# Patient Record
Sex: Female | Born: 1950
Health system: Southern US, Community
[De-identification: ages and names within clinical notes are randomized; demographics above are authoritative.]

## PROBLEM LIST (undated history)

## (undated) DIAGNOSIS — M25569 Pain in unspecified knee: Secondary | ICD-10-CM

## (undated) DIAGNOSIS — I10 Essential (primary) hypertension: Secondary | ICD-10-CM

## (undated) DIAGNOSIS — K219 Gastro-esophageal reflux disease without esophagitis: Secondary | ICD-10-CM

## (undated) DIAGNOSIS — I82409 Acute embolism and thrombosis of unspecified deep veins of unspecified lower extremity: Secondary | ICD-10-CM

## (undated) DIAGNOSIS — I2699 Other pulmonary embolism without acute cor pulmonale: Secondary | ICD-10-CM

## (undated) DIAGNOSIS — M1990 Unspecified osteoarthritis, unspecified site: Secondary | ICD-10-CM

## (undated) DIAGNOSIS — G8929 Other chronic pain: Secondary | ICD-10-CM

## (undated) HISTORY — DX: Gastro-esophageal reflux disease without esophagitis: K21.9

## (undated) HISTORY — PX: TUBAL LIGATION: SHX77

---

## 2001-07-13 ENCOUNTER — Ambulatory Visit (HOSPITAL_COMMUNITY): Admission: RE | Admit: 2001-07-13 | Discharge: 2001-07-13 | Payer: Self-pay | Admitting: Family Medicine

## 2001-07-13 ENCOUNTER — Encounter: Payer: Self-pay | Admitting: Family Medicine

## 2001-08-13 ENCOUNTER — Emergency Department (HOSPITAL_COMMUNITY): Admission: EM | Admit: 2001-08-13 | Discharge: 2001-08-13 | Payer: Self-pay | Admitting: Emergency Medicine

## 2001-09-13 ENCOUNTER — Ambulatory Visit (HOSPITAL_COMMUNITY): Admission: RE | Admit: 2001-09-13 | Discharge: 2001-09-13 | Payer: Self-pay | Admitting: Family Medicine

## 2001-09-13 ENCOUNTER — Encounter: Payer: Self-pay | Admitting: Family Medicine

## 2002-05-23 ENCOUNTER — Encounter: Payer: Self-pay | Admitting: *Deleted

## 2002-05-23 ENCOUNTER — Emergency Department (HOSPITAL_COMMUNITY): Admission: EM | Admit: 2002-05-23 | Discharge: 2002-05-23 | Payer: Self-pay | Admitting: *Deleted

## 2002-06-15 ENCOUNTER — Encounter (HOSPITAL_COMMUNITY): Admission: RE | Admit: 2002-06-15 | Discharge: 2002-07-15 | Payer: Self-pay | Admitting: Family Medicine

## 2002-09-11 ENCOUNTER — Emergency Department (HOSPITAL_COMMUNITY): Admission: EM | Admit: 2002-09-11 | Discharge: 2002-09-12 | Payer: Self-pay | Admitting: *Deleted

## 2002-09-12 ENCOUNTER — Encounter: Payer: Self-pay | Admitting: *Deleted

## 2003-03-18 ENCOUNTER — Emergency Department (HOSPITAL_COMMUNITY): Admission: EM | Admit: 2003-03-18 | Discharge: 2003-03-18 | Payer: Self-pay | Admitting: Emergency Medicine

## 2003-10-14 ENCOUNTER — Emergency Department (HOSPITAL_COMMUNITY): Admission: EM | Admit: 2003-10-14 | Discharge: 2003-10-14 | Payer: Self-pay | Admitting: Emergency Medicine

## 2003-11-13 ENCOUNTER — Emergency Department (HOSPITAL_COMMUNITY): Admission: EM | Admit: 2003-11-13 | Discharge: 2003-11-13 | Payer: Self-pay | Admitting: Emergency Medicine

## 2005-03-10 ENCOUNTER — Emergency Department (HOSPITAL_COMMUNITY): Admission: EM | Admit: 2005-03-10 | Discharge: 2005-03-10 | Payer: Self-pay | Admitting: Emergency Medicine

## 2006-03-04 ENCOUNTER — Emergency Department (HOSPITAL_COMMUNITY): Admission: EM | Admit: 2006-03-04 | Discharge: 2006-03-04 | Payer: Self-pay | Admitting: Emergency Medicine

## 2006-03-09 ENCOUNTER — Emergency Department (HOSPITAL_COMMUNITY): Admission: EM | Admit: 2006-03-09 | Discharge: 2006-03-09 | Payer: Self-pay | Admitting: Emergency Medicine

## 2006-04-20 ENCOUNTER — Encounter: Payer: Self-pay | Admitting: Orthopedic Surgery

## 2006-04-20 ENCOUNTER — Emergency Department (HOSPITAL_COMMUNITY): Admission: EM | Admit: 2006-04-20 | Discharge: 2006-04-20 | Payer: Self-pay | Admitting: Emergency Medicine

## 2006-08-30 ENCOUNTER — Emergency Department (HOSPITAL_COMMUNITY): Admission: EM | Admit: 2006-08-30 | Discharge: 2006-08-30 | Payer: Self-pay | Admitting: Emergency Medicine

## 2006-12-29 ENCOUNTER — Encounter: Payer: Self-pay | Admitting: Orthopedic Surgery

## 2006-12-29 ENCOUNTER — Emergency Department (HOSPITAL_COMMUNITY): Admission: EM | Admit: 2006-12-29 | Discharge: 2006-12-29 | Payer: Self-pay | Admitting: Emergency Medicine

## 2007-01-09 ENCOUNTER — Emergency Department (HOSPITAL_COMMUNITY): Admission: EM | Admit: 2007-01-09 | Discharge: 2007-01-09 | Payer: Self-pay | Admitting: Emergency Medicine

## 2007-01-26 ENCOUNTER — Ambulatory Visit (HOSPITAL_COMMUNITY): Admission: RE | Admit: 2007-01-26 | Discharge: 2007-01-26 | Payer: Self-pay | Admitting: Internal Medicine

## 2007-03-30 ENCOUNTER — Ambulatory Visit: Payer: Self-pay | Admitting: Orthopedic Surgery

## 2007-03-30 DIAGNOSIS — M25569 Pain in unspecified knee: Secondary | ICD-10-CM | POA: Insufficient documentation

## 2007-03-30 DIAGNOSIS — IMO0002 Reserved for concepts with insufficient information to code with codable children: Secondary | ICD-10-CM | POA: Insufficient documentation

## 2007-03-30 DIAGNOSIS — M549 Dorsalgia, unspecified: Secondary | ICD-10-CM | POA: Insufficient documentation

## 2007-03-30 DIAGNOSIS — M1710 Unilateral primary osteoarthritis, unspecified knee: Secondary | ICD-10-CM | POA: Insufficient documentation

## 2007-04-02 ENCOUNTER — Telehealth: Payer: Self-pay | Admitting: Orthopedic Surgery

## 2007-04-07 ENCOUNTER — Encounter: Payer: Self-pay | Admitting: Orthopedic Surgery

## 2007-04-07 ENCOUNTER — Encounter (HOSPITAL_COMMUNITY): Admission: RE | Admit: 2007-04-07 | Discharge: 2007-05-07 | Payer: Self-pay | Admitting: Orthopedic Surgery

## 2007-04-07 ENCOUNTER — Ambulatory Visit (HOSPITAL_COMMUNITY): Admission: RE | Admit: 2007-04-07 | Discharge: 2007-04-07 | Payer: Self-pay | Admitting: Orthopedic Surgery

## 2007-05-04 ENCOUNTER — Encounter: Payer: Self-pay | Admitting: Orthopedic Surgery

## 2007-05-10 ENCOUNTER — Encounter (HOSPITAL_COMMUNITY): Admission: RE | Admit: 2007-05-10 | Discharge: 2007-06-09 | Payer: Self-pay | Admitting: Orthopedic Surgery

## 2007-05-13 ENCOUNTER — Ambulatory Visit: Payer: Self-pay | Admitting: Orthopedic Surgery

## 2007-05-19 ENCOUNTER — Encounter: Payer: Self-pay | Admitting: Orthopedic Surgery

## 2007-08-27 ENCOUNTER — Encounter: Payer: Self-pay | Admitting: Orthopedic Surgery

## 2010-02-09 ENCOUNTER — Encounter: Payer: Self-pay | Admitting: Family Medicine

## 2010-10-28 LAB — URINALYSIS, ROUTINE W REFLEX MICROSCOPIC
Bilirubin Urine: NEGATIVE
Ketones, ur: NEGATIVE
Nitrite: NEGATIVE
Protein, ur: NEGATIVE
Urobilinogen, UA: 0.2
pH: 5.5

## 2010-11-09 ENCOUNTER — Emergency Department (HOSPITAL_COMMUNITY)
Admission: EM | Admit: 2010-11-09 | Discharge: 2010-11-09 | Disposition: A | Discharge status: Home / Self Care | Payer: Medicaid Other | Attending: Emergency Medicine | Admitting: Emergency Medicine

## 2010-11-09 ENCOUNTER — Encounter: Payer: Self-pay | Admitting: *Deleted

## 2010-11-09 ENCOUNTER — Emergency Department (HOSPITAL_COMMUNITY): Payer: Medicaid Other

## 2010-11-09 DIAGNOSIS — M25579 Pain in unspecified ankle and joints of unspecified foot: Secondary | ICD-10-CM | POA: Insufficient documentation

## 2010-11-09 DIAGNOSIS — M1990 Unspecified osteoarthritis, unspecified site: Secondary | ICD-10-CM | POA: Insufficient documentation

## 2010-11-09 DIAGNOSIS — J45909 Unspecified asthma, uncomplicated: Secondary | ICD-10-CM | POA: Insufficient documentation

## 2010-11-09 DIAGNOSIS — I10 Essential (primary) hypertension: Secondary | ICD-10-CM | POA: Insufficient documentation

## 2010-11-09 DIAGNOSIS — Z87891 Personal history of nicotine dependence: Secondary | ICD-10-CM | POA: Insufficient documentation

## 2010-11-09 DIAGNOSIS — M25571 Pain in right ankle and joints of right foot: Secondary | ICD-10-CM

## 2010-11-09 HISTORY — DX: Unspecified osteoarthritis, unspecified site: M19.90

## 2010-11-09 HISTORY — DX: Essential (primary) hypertension: I10

## 2010-11-09 MED ORDER — HYDROCODONE-ACETAMINOPHEN 5-325 MG PO TABS: 1.0000 | ORAL_TABLET | ORAL | Status: AC | PRN

## 2010-11-09 MED ORDER — HYDROCODONE-ACETAMINOPHEN 5-325 MG PO TABS
1.0000 | ORAL_TABLET | Freq: Once | ORAL | Status: AC
  Administered 2010-11-09: 1 via ORAL
  Filled 2010-11-09: qty 1

## 2010-11-09 NOTE — ED Notes (Signed)
Pt c/o right ankle pain x 1 week; pt denies any injury

## 2010-11-09 NOTE — ED Notes (Signed)
Pt a/ox4. Resp even and unlabored. NAD at this time. D/C instructions and Rx x1 reviewed with pt. Pt verbalized understanding. Pt ambulated with steady gate to POV.

## 2010-11-10 NOTE — ED Provider Notes (Signed)
History     CSN: [id number] Arrival date & time: 11/09/2010 10:58 AM   First MD Initiated Contact with Patient 11/09/10 1126      Chief Complaint  Patient presents with  . Ankle Pain    right    (Consider location/radiation/quality/duration/timing/severity/associated sxs/prior treatment) Patient is a 60 y.o. female presenting with ankle pain. The history is provided by the patient.  Ankle Pain  The incident occurred more than 1 week ago. Incident location: Patient denies injury.  She woke one week ago with pain in her right ankle. The pain is present in the right ankle. The pain is at a severity of 10/10. The pain is severe. The pain has been constant since onset. Pertinent negatives include no numbness, no inability to bear weight, no loss of motion, no muscle weakness, no loss of sensation and no tingling. The symptoms are aggravated by bearing weight and activity. She has tried acetaminophen for the symptoms. The treatment provided no relief.    Past Medical History  Diagnosis Date  . Arthritis   . Hypertension   . Asthma     History reviewed. No pertinent past surgical history.  History reviewed. No pertinent family history.  History  Substance Use Topics  . Smoking status: Former Games developer  . Smokeless tobacco: Not on file  . Alcohol Use: No    OB History    Grav Para Term Preterm Abortions TAB SAB Ect Mult Living                  Review of Systems  Constitutional: Negative for fever.  HENT: Negative for congestion, sore throat and neck pain.   Eyes: Negative.   Respiratory: Negative for chest tightness and shortness of breath.   Cardiovascular: Negative for chest pain.  Gastrointestinal: Negative for nausea and abdominal pain.  Genitourinary: Negative.   Musculoskeletal: Positive for arthralgias. Negative for myalgias and joint swelling.  Skin: Negative.  Negative for rash and wound.  Neurological: Negative for dizziness, tingling, weakness, light-headedness,  numbness and headaches.  Hematological: Negative.   Psychiatric/Behavioral: Negative.     Allergies  Review of patient's allergies indicates no known allergies.  Home Medications   Current Outpatient Rx  Name Route Sig Dispense Refill  . HYDROCODONE-ACETAMINOPHEN 5-325 MG PO TABS Oral Take 1 tablet by mouth every 4 (four) hours as needed for pain. 15 tablet 0    BP 149/87  Pulse 78  Temp(Src) 98.3 F (36.8 C) (Oral)  Resp 20  Ht 5\' 7"  (1.702 m)  Wt 210 lb (95.255 kg)  BMI 32.89 kg/m2  SpO2 99%  Physical Exam  Nursing note and vitals reviewed. Constitutional: She is oriented to person, place, and time. She appears well-developed and well-nourished.  HENT:  Head: Normocephalic.  Eyes: Conjunctivae are normal.  Neck: Normal range of motion. Neck supple.  Cardiovascular: Normal rate, regular rhythm and intact distal pulses.  Exam reveals no decreased pulses.   Pulses:      Dorsalis pedis pulses are 2+ on the right side, and 2+ on the left side.       Posterior tibial pulses are 2+ on the right side, and 2+ on the left side.       Pedal pulses normal.  Pulmonary/Chest: Effort normal. She has no wheezes.  Abdominal: Soft. Bowel sounds are normal. She exhibits no distension and no mass.  Musculoskeletal: Normal range of motion. She exhibits tenderness. She exhibits no edema.       Right ankle: She exhibits  normal range of motion, no ecchymosis, no deformity and normal pulse. tenderness. Lateral malleolus tenderness found. No head of 5th metatarsal and no proximal fibula tenderness found. Achilles tendon normal.  Neurological: She is alert and oriented to person, place, and time. She has normal strength. She displays no atrophy and no tremor. No cranial nerve deficit or sensory deficit. Gait normal.  Reflex Scores:      Achilles reflexes are 2+ on the right side and 2+ on the left side. Skin: Skin is warm, dry and intact.  Psychiatric: She has a normal mood and affect.    ED  Course  Procedures (including critical care time)  Labs Reviewed - No data to display Dg Ankle Complete Right  11/09/2010  *RADIOLOGY REPORT*  Clinical Data: Ankle pain  RIGHT ANKLE - COMPLETE 3+ VIEW  Comparison: None.  Findings: Ankle mortise intact.  Talar dome is normal.  No malleolar fracture.  No joint effusion.  IMPRESSION: No ankle fracture.  Original Report Authenticated By: Genevive Bi, M.D.     1. Ankle pain, right       MDM  Normal xray for atraumatic pain,  History of osteoarthritis.  Referral to ortho.  Ace wrap supplied.  Pain meds provided.        Candis Musa, PA 11/10/10 [phone]

## 2010-11-15 NOTE — ED Provider Notes (Signed)
Evaluation and management procedures were performed by the PA/NP under my supervision/collaboration.    Chalmers Iddings D Farron Lafond, MD 11/15/10 2119 

## 2011-03-11 ENCOUNTER — Emergency Department (HOSPITAL_COMMUNITY)
Admission: EM | Admit: 2011-03-11 | Discharge: 2011-03-11 | Disposition: A | Discharge status: Home / Self Care | Payer: Self-pay | Attending: Emergency Medicine | Admitting: Emergency Medicine

## 2011-03-11 ENCOUNTER — Encounter (HOSPITAL_COMMUNITY): Payer: Self-pay | Admitting: *Deleted

## 2011-03-11 DIAGNOSIS — M129 Arthropathy, unspecified: Secondary | ICD-10-CM | POA: Insufficient documentation

## 2011-03-11 DIAGNOSIS — R21 Rash and other nonspecific skin eruption: Secondary | ICD-10-CM | POA: Insufficient documentation

## 2011-03-11 DIAGNOSIS — Z2089 Contact with and (suspected) exposure to other communicable diseases: Secondary | ICD-10-CM | POA: Insufficient documentation

## 2011-03-11 DIAGNOSIS — Z207 Contact with and (suspected) exposure to pediculosis, acariasis and other infestations: Secondary | ICD-10-CM

## 2011-03-11 DIAGNOSIS — I10 Essential (primary) hypertension: Secondary | ICD-10-CM | POA: Insufficient documentation

## 2011-03-11 MED ORDER — PERMETHRIN 5 % EX CREA: TOPICAL_CREAM | CUTANEOUS | Status: AC

## 2011-03-11 NOTE — ED Provider Notes (Signed)
History     CSN: [id number]  Arrival date & time 03/11/11  1309   First MD Initiated Contact with Patient 03/11/11 1443      Chief Complaint  Patient presents with  . Rash    (Consider location/radiation/quality/duration/timing/severity/associated sxs/prior treatment) HPI Comments: Patient c/o itching rash to her bilateral arms and chest for several days.  States that her grandchild was recently diagnosed with scabies and other family members have also been treated for same.  She denies fever, pain or swelling.  Patient is a 61 y.o. female presenting with rash. The history is provided by the patient. No language interpreter was used.  Rash  This is a new problem. The current episode started 2 days ago. The problem has been gradually worsening. Associated with: scabies exposure. There has been no fever. The rash is present on the trunk, right arm and left arm. The patient is experiencing no pain. The pain has been constant since onset. Associated symptoms include itching. Pertinent negatives include no blisters, no pain and no weeping. She has tried nothing for the symptoms. The treatment provided no relief.    Past Medical History  Diagnosis Date  . Arthritis   . Hypertension   . Asthma     History reviewed. No pertinent past surgical history.  History reviewed. No pertinent family history.  History  Substance Use Topics  . Smoking status: Never Smoker   . Smokeless tobacco: Not on file  . Alcohol Use: No    OB History    Grav Para Term Preterm Abortions TAB SAB Ect Mult Living                  Review of Systems  Constitutional: Negative for fever and chills.  HENT: Negative for sore throat, trouble swallowing and neck pain.   Respiratory: Negative for cough and shortness of breath.   Gastrointestinal: Negative for nausea and vomiting.  Musculoskeletal: Negative for myalgias, back pain and arthralgias.  Skin: Positive for itching and rash.  Neurological: Negative  for dizziness, weakness and numbness.  Hematological: Does not bruise/bleed easily.  All other systems reviewed and are negative.    Allergies  Review of patient's allergies indicates no known allergies.  Home Medications   Current Outpatient Rx  Name Route Sig Dispense Refill  . ACETAMINOPHEN 500 MG PO TABS Oral Take 1,000 mg by mouth every 6 (six) hours as needed. For pain    . IBUPROFEN 200 MG PO TABS Oral Take 400 mg by mouth every 6 (six) hours as needed. For pain      BP 140/107  Pulse 93  Temp(Src) 97.9 F (36.6 C) (Oral)  Resp 18  Ht 5\' 6"  (1.676 m)  Wt 210 lb (95.255 kg)  BMI 33.89 kg/m2  SpO2 98%  Physical Exam  Nursing note and vitals reviewed. Constitutional: She is oriented to person, place, and time. She appears well-developed and well-nourished. No distress.  HENT:  Head: Normocephalic and atraumatic.  Mouth/Throat: Oropharynx is clear and moist.  Neck: Normal range of motion. Neck supple.  Cardiovascular: Normal rate, regular rhythm and normal heart sounds.   Pulmonary/Chest: Effort normal and breath sounds normal.  Musculoskeletal: Normal range of motion. She exhibits no edema and no tenderness.  Lymphadenopathy:    She has no cervical adenopathy.  Neurological: She is alert and oriented to person, place, and time. She exhibits normal muscle tone. Coordination normal.  Skin: Skin is warm and dry. Rash noted.  Few scattered slightly erythematous papules to bilateral forearms. No other significant rash seen    ED Course  Procedures (including critical care time)       MDM   Patient is alert and oriented. She is nontoxic appearing. Few scattered erythematous papules to the upper extremities. Finger webs are spared. Recent exposure to scabies. I will prescribe permethrin cream she agrees to followup with her primary care physician.          Dorleen Kissel L. Mckenleigh Tarlton, [place] 03/13/11 2000

## 2011-03-11 NOTE — ED Notes (Signed)
Itching rash,  None seen at triage.

## 2011-03-11 NOTE — ED Notes (Signed)
Pt concerned because she has had a recent exposure to scabies from her grandchild and now she is breaking out,

## 2011-03-13 NOTE — ED Provider Notes (Signed)
Medical screening examination/treatment/procedure(s) were performed by non-physician practitioner and as supervising physician I was immediately available for consultation/collaboration.    Nelia Shi, MD 03/13/11 [phone]

## 2011-09-13 ENCOUNTER — Emergency Department (HOSPITAL_COMMUNITY)
Admission: EM | Admit: 2011-09-13 | Discharge: 2011-09-13 | Disposition: A | Discharge status: Home / Self Care | Payer: Self-pay | Attending: Emergency Medicine | Admitting: Emergency Medicine

## 2011-09-13 ENCOUNTER — Encounter (HOSPITAL_COMMUNITY): Payer: Self-pay | Admitting: Emergency Medicine

## 2011-09-13 DIAGNOSIS — M25569 Pain in unspecified knee: Secondary | ICD-10-CM

## 2011-09-13 DIAGNOSIS — J45909 Unspecified asthma, uncomplicated: Secondary | ICD-10-CM | POA: Insufficient documentation

## 2011-09-13 DIAGNOSIS — Z86718 Personal history of other venous thrombosis and embolism: Secondary | ICD-10-CM | POA: Insufficient documentation

## 2011-09-13 DIAGNOSIS — M1710 Unilateral primary osteoarthritis, unspecified knee: Secondary | ICD-10-CM | POA: Insufficient documentation

## 2011-09-13 DIAGNOSIS — I10 Essential (primary) hypertension: Secondary | ICD-10-CM | POA: Insufficient documentation

## 2011-09-13 HISTORY — DX: Acute embolism and thrombosis of unspecified deep veins of unspecified lower extremity: I82.409

## 2011-09-13 MED ORDER — HYDROCODONE-ACETAMINOPHEN 5-325 MG PO TABS: 1.0000 | ORAL_TABLET | ORAL | Status: AC | PRN

## 2011-09-13 MED ORDER — HYDROCODONE-ACETAMINOPHEN 5-325 MG PO TABS
1.0000 | ORAL_TABLET | Freq: Once | ORAL | Status: AC
  Administered 2011-09-13: 1 via ORAL
  Filled 2011-09-13: qty 1

## 2011-09-13 MED ORDER — IBUPROFEN 800 MG PO TABS
800.0000 mg | ORAL_TABLET | Freq: Once | ORAL | Status: AC
  Administered 2011-09-13: 800 mg via ORAL
  Filled 2011-09-13: qty 1

## 2011-09-13 NOTE — ED Notes (Signed)
States that she has been having pain in her right knee that has gotten worse over the last 2 weeks, states that it is painful for her to walk on.  Pt states she does have a history of a DVT.

## 2011-09-13 NOTE — ED Provider Notes (Signed)
History     CSN: [id number]  Arrival date & time 09/13/11  2536   First MD Initiated Contact with Patient 09/13/11 [phone]      Chief Complaint  Patient presents with  . Knee Pain    (Consider location/radiation/quality/duration/timing/severity/associated sxs/prior treatment) HPI Cristina Munoz is a 61 y.o. female who presents to the Emergency Department complaining of right knee pain that has been getting progressively  worse for two weeks. No known injury. H/o arthritis. Has taken tylenol with no relief. Denies numbness, tingling, weakness, locking sensation.    Past Medical History  Diagnosis Date  . Arthritis   . Hypertension   . Asthma   . DVT (deep venous thrombosis)     History reviewed. No pertinent past surgical history.  No family history on file.  History  Substance Use Topics  . Smoking status: Never Smoker   . Smokeless tobacco: Not on file  . Alcohol Use: No    OB History    Grav Para Term Preterm Abortions TAB SAB Ect Mult Living                  Review of Systems  Constitutional: Negative for fever.       10 Systems reviewed and are negative for acute change except as noted in the HPI.  HENT: Negative for congestion.   Eyes: Negative for discharge and redness.  Respiratory: Negative for cough and shortness of breath.   Cardiovascular: Negative for chest pain.  Gastrointestinal: Negative for vomiting and abdominal pain.  Musculoskeletal: Negative for back pain.       Right knee pain  Skin: Negative for rash.  Neurological: Negative for syncope, numbness and headaches.  Psychiatric/Behavioral:       No behavior change.    Allergies  Review of patient's allergies indicates no known allergies.  Home Medications   Current Outpatient Rx  Name Route Sig Dispense Refill  . ACETAMINOPHEN 500 MG PO TABS Oral Take 1,000 mg by mouth every 6 (six) hours as needed. For pain    . IBUPROFEN 200 MG PO TABS Oral Take 400 mg by mouth every 6 (six) hours  as needed. For pain      BP 155/95  Pulse 78  Temp 98.7 F (37.1 C) (Oral)  Resp 20  Ht 5\' 5"  (1.651 m)  Wt 220 lb (99.791 kg)  BMI 36.61 kg/m2  SpO2 100%  Physical Exam  Nursing note and vitals reviewed. Constitutional: She is oriented to person, place, and time. She appears well-developed and well-nourished.       Awake, alert, nontoxic appearance.  HENT:  Head: Atraumatic.  Eyes: Right eye exhibits no discharge. Left eye exhibits no discharge.  Neck: Neck supple.  Cardiovascular: Normal heart sounds.   Pulmonary/Chest: She is in respiratory distress. She exhibits no tenderness.  Abdominal: Soft. There is no tenderness. There is no rebound.  Musculoskeletal: She exhibits tenderness.       Baseline ROM, no obvious new focal weakness.Bilateral evidence of arthritis in knees. +crepitus bilaterally. No effusion to right knee.Tenderness to medial joint line  Neurological: She is alert and oriented to person, place, and time. She has normal reflexes.       Mental status and motor strength appears baseline for patient and situation.  Skin: No rash noted.  Psychiatric: She has a normal mood and affect.    ED Course  Procedures (including critical care time)     MDM  Patient with knee pain c/w degenerative  arthritis. Referral to orthopedist. Antiinflammatory and analgesic given. Pt stable in ED with no significant deterioration in condition.The patient appears reasonably screened and/or stabilized for discharge and I doubt any other medical condition or other [facility] requiring further screening, evaluation, or treatment in the ED at this time prior to discharge.  MDM Reviewed: nursing note and vitals           Nicoletta Dress. Colon Branch, MD 09/13/11 [phone]

## 2011-12-20 ENCOUNTER — Encounter (HOSPITAL_COMMUNITY): Payer: Self-pay

## 2011-12-20 ENCOUNTER — Emergency Department (HOSPITAL_COMMUNITY): Payer: Medicaid Other

## 2011-12-20 ENCOUNTER — Emergency Department (HOSPITAL_COMMUNITY)
Admission: EM | Admit: 2011-12-20 | Discharge: 2011-12-20 | Disposition: A | Discharge status: Home / Self Care | Payer: Medicaid Other | Attending: Emergency Medicine | Admitting: Emergency Medicine

## 2011-12-20 DIAGNOSIS — J45901 Unspecified asthma with (acute) exacerbation: Secondary | ICD-10-CM | POA: Insufficient documentation

## 2011-12-20 DIAGNOSIS — R05 Cough: Secondary | ICD-10-CM | POA: Insufficient documentation

## 2011-12-20 DIAGNOSIS — R071 Chest pain on breathing: Secondary | ICD-10-CM | POA: Insufficient documentation

## 2011-12-20 DIAGNOSIS — R0789 Other chest pain: Secondary | ICD-10-CM

## 2011-12-20 DIAGNOSIS — I10 Essential (primary) hypertension: Secondary | ICD-10-CM | POA: Insufficient documentation

## 2011-12-20 DIAGNOSIS — G479 Sleep disorder, unspecified: Secondary | ICD-10-CM | POA: Insufficient documentation

## 2011-12-20 DIAGNOSIS — R059 Cough, unspecified: Secondary | ICD-10-CM | POA: Insufficient documentation

## 2011-12-20 DIAGNOSIS — M129 Arthropathy, unspecified: Secondary | ICD-10-CM | POA: Insufficient documentation

## 2011-12-20 DIAGNOSIS — Z8679 Personal history of other diseases of the circulatory system: Secondary | ICD-10-CM | POA: Insufficient documentation

## 2011-12-20 LAB — CBC WITH DIFFERENTIAL/PLATELET
Basophils Absolute: 0 10*3/uL (ref 0.0–0.1)
Basophils Relative: 0 % (ref 0–1)
Eosinophils Absolute: 0.1 10*3/uL (ref 0.0–0.7)
Eosinophils Relative: 3 % (ref 0–5)
Lymphs Abs: 1.7 10*3/uL (ref 0.7–4.0)
MCH: 28.3 pg (ref 26.0–34.0)
MCHC: 33.1 g/dL (ref 30.0–36.0)
MCV: 85.4 fL (ref 78.0–100.0)
Neutrophils Relative %: 48 % (ref 43–77)
Platelets: 210 10*3/uL (ref 150–400)
RDW: 15 % (ref 11.5–15.5)

## 2011-12-20 LAB — BASIC METABOLIC PANEL
Calcium: 9.4 mg/dL (ref 8.4–10.5)
GFR calc Af Amer: >90 mL/min (ref >90)
GFR calc non Af Amer: >90 mL/min (ref >90)
Glucose, Bld: 101 mg/dL — ABNORMAL HIGH (ref 70–99)
Potassium: 4 mEq/L (ref 3.5–5.1)
Sodium: 139 mEq/L (ref 135–145)

## 2011-12-20 MED ORDER — IOHEXOL 350 MG/ML SOLN
100.0000 mL | Freq: Once | INTRAVENOUS | Status: AC | PRN
  Administered 2011-12-20: 100 mL via INTRAVENOUS

## 2011-12-20 NOTE — ED Notes (Signed)
Pt reports right side rib pain since yesterday, denies any known injury, sob at times, and w/ movement.

## 2011-12-20 NOTE — ED Provider Notes (Addendum)
History  This chart was scribed for Hilario Quarry, MD by Erskine Emery, ED Scribe. This patient was seen in room APA01/[facility] and the patient's care was started at 11:13.   CSN: [id number]  Arrival date & time 12/20/11  1018   First MD Initiated Contact with Patient 12/20/11 1113      Chief Complaint  Patient presents with  . Chest Pain    right side rib    (Consider location/radiation/quality/duration/timing/severity/associated sxs/prior treatment) The history is provided by the patient. No language interpreter was used.  Cristina Munoz is a 61 y.o. female who presents to the Emergency Department complaining of sudden onset constant right-sided chest pain since last night, while sitting. Pt denies the pain is sharp and reports it feels "like a lump." Pt reports some associated SOB and intermittent sleep disturbance and a baseline mild cough (related to GERD). Pt denies any associated throat soreness, nausea, emesis, fever, chills, unusual pain swelling in legs, or any aggravation of pain upon breathing. Pt did not take anything for the pain. Pt has a h/o arthritis, HTN, scoliosis, DVT, and GERD. Pt has never been a smoker.  Past Medical History  Diagnosis Date  . Arthritis   . Hypertension   . Asthma   . DVT (deep venous thrombosis)     History reviewed. No pertinent past surgical history.  No family history on file.  History  Substance Use Topics  . Smoking status: Never Smoker   . Smokeless tobacco: Not on file  . Alcohol Use: No    OB History    Grav Para Term Preterm Abortions TAB SAB Ect Mult Living                  Review of Systems  Constitutional: Negative for fever and chills.  HENT: Negative for sore throat.   Respiratory: Positive for cough and shortness of breath.   Cardiovascular: Positive for chest pain.  Gastrointestinal: Negative for nausea and vomiting.  Musculoskeletal:       Calf pain, bilaterally  Neurological: Negative for weakness.    Psychiatric/Behavioral: Positive for sleep disturbance.  All other systems reviewed and are negative.    Allergies  Fish allergy  Home Medications   Current Outpatient Rx  Name  Route  Sig  Dispense  Refill  . ACETAMINOPHEN 500 MG PO TABS   Oral   Take 1,000 mg by mouth every 6 (six) hours as needed. For pain         . IBUPROFEN 200 MG PO TABS   Oral   Take 400 mg by mouth every 6 (six) hours as needed. For pain           BP 162/102  Pulse 66  Temp 98.2 F (36.8 C) (Oral)  Resp 20  SpO2 100%  Physical Exam  Nursing note and vitals reviewed. Constitutional: She is oriented to person, place, and time. She appears well-developed and well-nourished. No distress.  HENT:  Head: Normocephalic and atraumatic.  Eyes: EOM are normal. Pupils are equal, round, and reactive to light.  Neck: Neck supple. No tracheal deviation present.  Cardiovascular: Normal rate and regular rhythm.   Pulmonary/Chest: Effort normal. No respiratory distress.  Abdominal: Soft. She exhibits no distension.  Musculoskeletal: Normal range of motion. She exhibits edema and tenderness.       Tenderness to the calves bilaterally.  Neurological: She is alert and oriented to person, place, and time.  Skin: Skin is warm and dry.  Psychiatric: She  has a normal mood and affect.    ED Course  Procedures (including critical care time) DIAGNOSTIC STUDIES: Oxygen Saturation is 100% on room air, normal by my interpretation.    COORDINATION OF CARE: 11:52--I evaluated the patient and we discussed a treatment plan including chest x-Trichelle Lehan and d-dimer to which the pt agreed.    Labs Reviewed - No data to display Dg Chest 2 View  12/20/2011  *RADIOLOGY REPORT*  Clinical Data: Chest pain, shortness of breath, and history of asthma  CHEST - 2 VIEW  Comparison: None.  Findings: Mild cardiomegaly. Pulmonary vascularity is upper normal. Trachea is midline.  No pulmonary edema or focal airspace disease is  identified.  The lung volumes appear normal.  There is no pleural effusion or pneumothorax.  Trachea is midline.  Slight respiratory motion on the lateral view.  Typical degenerative changes of the thoracic spine.  IMPRESSION: Mild cardiomegaly and borderline pulmonary vascular congestion.   Original Report Authenticated By: Britta Mccreedy, M.D.      No diagnosis found.   Date: 12/20/2011  Rate: 60  Rhythm: normal sinus rhythm  QRS Axis: normal  Intervals: normal  ST/T Wave abnormalities: normal  Conduction Disutrbances: none  Narrative Interpretation: unremarkable     I personally performed the services described in this documentation, which was scribed in my presence. The recorded information has been reviewed and considered. e MDM  Patient is a 61 year old female with right anterior lower chest pain that is point tender with palpation. She also has a pleuritic component which may just be musculoskeletal. She had an elevated d-dimer and had a CT angiogram done. There is no evidence of pulmonary embolism on CT angiogram. She is discharged home to use nonsteroidals for pain . She does have a history of hypertension and is advised to f/u with pmd and restarte meds if indicated.   Hilario Quarry, MD 12/20/11 1445  Hilario Quarry, MD 12/20/11 1610

## 2013-04-26 ENCOUNTER — Emergency Department (HOSPITAL_COMMUNITY): Payer: Self-pay

## 2013-04-26 ENCOUNTER — Emergency Department (HOSPITAL_COMMUNITY)
Admission: EM | Admit: 2013-04-26 | Discharge: 2013-04-26 | Disposition: A | Discharge status: Home / Self Care | Payer: Self-pay | Attending: Emergency Medicine | Admitting: Emergency Medicine

## 2013-04-26 ENCOUNTER — Encounter (HOSPITAL_COMMUNITY): Payer: Self-pay | Admitting: Emergency Medicine

## 2013-04-26 DIAGNOSIS — M25561 Pain in right knee: Secondary | ICD-10-CM

## 2013-04-26 DIAGNOSIS — Z86718 Personal history of other venous thrombosis and embolism: Secondary | ICD-10-CM | POA: Insufficient documentation

## 2013-04-26 DIAGNOSIS — J45909 Unspecified asthma, uncomplicated: Secondary | ICD-10-CM | POA: Insufficient documentation

## 2013-04-26 DIAGNOSIS — M1711 Unilateral primary osteoarthritis, right knee: Secondary | ICD-10-CM

## 2013-04-26 DIAGNOSIS — G8929 Other chronic pain: Secondary | ICD-10-CM | POA: Insufficient documentation

## 2013-04-26 DIAGNOSIS — M1710 Unilateral primary osteoarthritis, unspecified knee: Secondary | ICD-10-CM | POA: Insufficient documentation

## 2013-04-26 DIAGNOSIS — IMO0002 Reserved for concepts with insufficient information to code with codable children: Secondary | ICD-10-CM | POA: Insufficient documentation

## 2013-04-26 DIAGNOSIS — I10 Essential (primary) hypertension: Secondary | ICD-10-CM | POA: Insufficient documentation

## 2013-04-26 MED ORDER — HYDROCODONE-ACETAMINOPHEN 5-325 MG PO TABS: ORAL_TABLET | ORAL | Status: DC

## 2013-04-26 MED ORDER — TRAMADOL HCL 50 MG PO TABS
100.0000 mg | ORAL_TABLET | Freq: Once | ORAL | Status: AC
  Administered 2013-04-26: 100 mg via ORAL
  Filled 2013-04-26: qty 2

## 2013-04-26 MED ORDER — ACETAMINOPHEN 325 MG PO TABS
650.0000 mg | ORAL_TABLET | Freq: Once | ORAL | Status: AC
  Administered 2013-04-26: 650 mg via ORAL
  Filled 2013-04-26: qty 2

## 2013-04-26 MED ORDER — IBUPROFEN 400 MG PO TABS
600.0000 mg | ORAL_TABLET | Freq: Once | ORAL | Status: AC
  Administered 2013-04-26: 600 mg via ORAL
  Filled 2013-04-26: qty 2

## 2013-04-26 NOTE — ED Provider Notes (Addendum)
CSN: [id number]     Arrival date & time 04/26/13  0945 History  This chart was scribed for Ward GivensIva L Rosie Torrez, MD by Danella Maiersaroline Early, ED Scribe. This patient was seen in room APA06/APA06 and the patient's care was started at 10:33 AM.    Chief Complaint  Patient presents with  . Knee Pain     (Consider location/radiation/quality/duration/timing/severity/associated sxs/prior Treatment) The history is provided by the patient. No language interpreter was used.   HPI Comments: Cristina Mirermanda J Munoz is a 63 y.o. female who presents to the Emergency Department complaining of intermittent right knee pain that is chronic in nature but worsened since she woke up this morning. She denies any pain at all yesterday. She denies injuries, trauma, or change in activity. She states moving the knee and bearing weight on the leg makes the pain worse. She denies fevers. She reports h/o DVT in the same leg several years ago. She notes some pain on rainy days but not as bad as this. She states she was told she might need a knee replacement years ago. She has seen Dr Romeo AppleHarrison, orthopedics in the past.  She does not smoke or drink alcohol. She does not work.   PCP none  Past Medical History  Diagnosis Date  . Arthritis   . Hypertension   . Asthma   . DVT (deep venous thrombosis)    History reviewed. No pertinent past surgical history. No family history on file. History  Substance Use Topics  . Smoking status: Never Smoker   . Smokeless tobacco: Not on file  . Alcohol Use: No   unemployed  OB History   Grav Para Term Preterm Abortions TAB SAB Ect Mult Living                 Review of Systems  Musculoskeletal: Positive for arthralgias (right knee).  All other systems reviewed and are negative.      Allergies  Fish allergy  Home Medications   Current Outpatient Rx  Name  Route  Sig  Dispense  Refill  . ibuprofen (ADVIL,MOTRIN) 200 MG tablet   Oral   Take 400 mg by mouth every 6 (six) hours as needed.  For pain          BP 156/81  Pulse 80  Temp(Src) 98.7 F (37.1 C) (Oral)  Resp 16  Ht 5\' 6"  (1.676 m)  Wt 220 lb (99.791 kg)  BMI 35.53 kg/m2  SpO2 97%  Vital signs normal   Physical Exam  Nursing note and vitals reviewed. Constitutional: She is oriented to person, place, and time. She appears well-developed and well-nourished.  Non-toxic appearance. She does not appear ill. No distress.  HENT:  Head: Normocephalic and atraumatic.  Right Ear: External ear normal.  Left Ear: External ear normal.  Nose: Nose normal. No mucosal edema or rhinorrhea.  Mouth/Throat: Mucous membranes are normal. No dental abscesses or uvula swelling.  Eyes: Conjunctivae and EOM are normal. Pupils are equal, round, and reactive to light.  Neck: Normal range of motion and full passive range of motion without pain. Neck supple.  Pulmonary/Chest: Effort normal. No respiratory distress. She has no rhonchi. She exhibits no crepitus.  Abdominal: Normal appearance.  Musculoskeletal: Normal range of motion. She exhibits tenderness. She exhibits no edema.       Legs: Moves all extremities well.  tender over the medial joint space of the right knee with possible mild effusion. Tender in the popliteal area. nontender to palpation in the calf,  but has some pain in her popliteal area without masses.   Neurological: She is alert and oriented to person, place, and time. She has normal strength. No cranial nerve deficit.  Skin: Skin is warm, dry and intact. No rash noted. No erythema. No pallor.  Psychiatric: She has a normal mood and affect. Her speech is normal and behavior is normal. Her mood appears not anxious.    ED Course  Procedures (including critical care time) Medications  ibuprofen (ADVIL,MOTRIN) tablet 600 mg (600 mg Oral Given 04/26/13 1125)  traMADol (ULTRAM) tablet 100 mg (100 mg Oral Given 04/26/13 1125)  acetaminophen (TYLENOL) tablet 650 mg (650 mg Oral Given 04/26/13 1124)    DIAGNOSTIC  STUDIES: Oxygen Saturation is 97% on RA, normal by my interpretation.    COORDINATION OF CARE: 11:11 AM- Discussed treatment plan with pt which includes venous US. Pt agrees to plan.  1:04 PM- Pt states her pain is improved. Let pt know the venous US was negative. Discussed x-ray results showing arthritic changes. Discussed plan to f/u with her orthopaedist, who is also on call. Pt agrees to plan.    Labs Review Labs Reviewed - No data to display Imaging Review US Venous Img Lower Unilateral Right  04/26/2013   CLINICAL DATA:  Right leg pain and swelling.  EXAM: RIGHT LOWER EXTREMITY VENOUS DOPPLER ULTRASOUND  TECHNIQUE: Gray-scale sonography with graded compression, as well as color Doppler and duplex ultrasound, were performed to evaluate the deep venous system from the level of the common femoral vein through the popliteal and proximal calf veins. Spectral Doppler was utilized to evaluate flow at rest and with distal augmentation maneuvers.  COMPARISON:  None.  FINDINGS: Thrombus within deep veins:  None visualized.  Compressibility of deep veins:  Normal.  Duplex waveform respiratory phasicity:  Normal.  Duplex waveform response to augmentation:  Normal.  Venous reflux:  None visualized.  Other findings:  None visualized.  IMPRESSION: No evidence of a right lower extremity deep venous thrombosis.   Electronically Signed   By: Amie Portland M.D.   On: 04/26/2013 12:27   Dg Knee Complete 4 Views Right  04/26/2013   CLINICAL DATA:  Atraumatic right knee pain since this morning.  EXAM: RIGHT KNEE - COMPLETE 4+ VIEW  COMPARISON:  None.  FINDINGS: The bones appear adequately mineralized for age. There is chondrocalcinosis of the menisci. There is narrowing of the medial joint compartment. There is beaking of the tibial spines. There is a small spur from the periphery of the medial tibial condyles. There is no joint effusion. There are small spurs from the superior and inferior margins of the articular  surface of the patella.  IMPRESSION: There is moderate degenerative change involving all 3 joint compartments of the right knee. There is no acute fracture or dislocation. Further evaluation with elective MRI would be of value when the patient can tolerate the procedure.   Electronically Signed   By: David  [place]   On: 04/26/2013 11:08     EKG Interpretation None      MDM   Final diagnoses:  Knee pain, right  Degenerative arthritis of right knee   Discharge medications norco #10 OTC ibuprofen or aleve   Plan discharge   I personally performed the services described in this documentation, which was scribed in my presence. The recorded information has been reviewed and considered.  Devoria Albe, MD, FACEP   Ward Givens, MD 04/26/13 1511  Ward Givens, MD 04/26/13 [phone]

## 2013-04-26 NOTE — Discharge Instructions (Signed)
Take ibuprofen 600 mg 4 times a day OR aleve (naprosyn) 2 tabs twice a day for pain. Take the norco for pain keeping you awake at night. Try ice/ heat for comfort. Recheck with Dr Romeo Apple, your orthopedist, if you aren't improving in the next week.  Cryotherapy Cryotherapy means treatment with cold. Ice or gel packs can be used to reduce both pain and swelling. Ice is the most helpful within the first 24 to 48 hours after an injury or flareup from overusing a muscle or joint. Sprains, strains, spasms, burning pain, shooting pain, and aches can all be eased with ice. Ice can also be used when recovering from surgery. Ice is effective, has very few side effects, and is safe for most people to use. PRECAUTIONS  Ice is not a safe treatment option for people with:  Raynaud's phenomenon. This is a condition affecting small blood vessels in the extremities. Exposure to cold may cause your problems to return.  Cold hypersensitivity. There are many forms of cold hypersensitivity, including:  Cold urticaria. Red, itchy hives appear on the skin when the tissues begin to warm after being iced.  Cold erythema. This is a red, itchy rash caused by exposure to cold.  Cold hemoglobinuria. Red blood cells break down when the tissues begin to warm after being iced. The hemoglobin that carry oxygen are passed into the urine because they cannot combine with blood proteins fast enough.  Numbness or altered sensitivity in the area being iced. If you have any of the following conditions, do not use ice until you have discussed cryotherapy with your caregiver:  Heart conditions, such as arrhythmia, angina, or chronic heart disease.  High blood pressure.  Healing wounds or open skin in the area being iced.  Current infections.  Rheumatoid arthritis.  Poor circulation.  Diabetes. Ice slows the blood flow in the region it is applied. This is beneficial when trying to stop inflamed tissues from spreading  irritating chemicals to surrounding tissues. However, if you expose your skin to cold temperatures for too long or without the proper protection, you can damage your skin or nerves. Watch for signs of skin damage due to cold. HOME CARE INSTRUCTIONS Follow these tips to use ice and cold packs safely.  Place a dry or damp towel between the ice and skin. A damp towel will cool the skin more quickly, so you may need to shorten the time that the ice is used.  For a more rapid response, add gentle compression to the ice.  Ice for no more than 10 to 20 minutes at a time. The bonier the area you are icing, the less time it will take to get the benefits of ice.  Check your skin after 5 minutes to make sure there are no signs of a poor response to cold or skin damage.  Rest 20 minutes or more in between uses.  Once your skin is numb, you can end your treatment. You can test numbness by very lightly touching your skin. The touch should be so light that you do not see the skin dimple from the pressure of your fingertip. When using ice, most people will feel these normal sensations in this order: cold, burning, aching, and numbness.  Do not use ice on someone who cannot communicate their responses to pain, such as small children or people with dementia. HOW TO MAKE AN ICE PACK Ice packs are the most common way to use ice therapy. Other methods include ice massage, ice  baths, and cryo-sprays. Muscle creams that cause a cold, tingly feeling do not offer the same benefits that ice offers and should not be used as a substitute unless recommended by your caregiver. To make an ice pack, do one of the following:  Place crushed ice or a bag of frozen vegetables in a sealable plastic bag. Squeeze out the excess air. Place this bag inside another plastic bag. Slide the bag into a pillowcase or place a damp towel between your skin and the bag.  Mix 3 parts water with 1 part rubbing alcohol. Freeze the mixture in a  sealable plastic bag. When you remove the mixture from the freezer, it will be slushy. Squeeze out the excess air. Place this bag inside another plastic bag. Slide the bag into a pillowcase or place a damp towel between your skin and the bag. SEEK MEDICAL CARE IF:  You develop white spots on your skin. This may give the skin a blotchy (mottled) appearance.  Your skin turns blue or pale.  Your skin becomes waxy or hard.  Your swelling gets worse. MAKE SURE YOU:   Understand these instructions.  Will watch your condition.  Will get help right away if you are not doing well or get worse. Document Released: 09/02/2010 Document Revised: 03/31/2011 Document Reviewed: 09/02/2010 [facility] Patient Information 2014 [place], [place].  Heat Therapy Heat therapy can help ease achy, tense, stiff, and tight muscles and joints. Heat should not be used on new injuries. Wait at least 48 hours after the injury before using heat therapy. Heat also should not be used for discomfort or pain that occurs right after doing an activity. If you still have pain or stiffness 3 hours after finishing the activity, then heat therapy may be used. PRECAUTIONS  High heat or prolonged exposure to heat can cause burns. Be careful when using heat therapy to avoid burning your skin. If you have any of the following conditions, do not use heat until you have discussed heat therapy with your caregiver:  Poor circulation.  Healing wounds or scarred skin in the area being treated.  Diabetes, heart disease, or high blood pressure.  Numbness of the area being treated.  Unusual swelling of the area being treated.  Active infections.  Blood clots.  Cancer.  Inability to communicate your response to pain. This can include young children and people with dementia. HOME CARE INSTRUCTIONS Moist heat pack  Soak a clean towel in warm water, and squeeze out the extra water. The water temperature should be comfortable to the  skin.  Put the warm, wet towel in a plastic bag.  Place a thin, dry towel between your skin and the bag.  Put the heat pack on the area for 5 minutes, and check your skin. Your skin may be pink, but it should not be red.  Leave the heat pack on the area for a total of 15 to 30 minutes.  Repeat this every 2 to 4 hours while awake. Do not use heat while you are sleeping. Warm water bath  Fill a tub with warm water. The water temperature should be comfortable to the skin.  Place the affected body part in the tub.  Soak the area for 20 to 40 minutes.  Repeat as needed. Hot water bottle  Fill the water bottle half full with hot water.  Press out the extra air. Close the cap tightly.  Place a dry towel between your skin and the bottle.  Put the bottle on  the area for 5 minutes, and check your skin. Your skin may be pink, but it should not be red.  Leave the bottle on the area for a total of 15 to 30 minutes.  Repeat this every 2 to 4 hours while awake. Electric heating pad  Place a dry towel between your skin and the heating pad.  Set the heating pad on low heat.  Put the heating pad on the area for 10 minutes, and check your skin. Your skin may be pink, but it should not be red.  Leave the heating pad on the area for a total of 20 to 40 minutes.  Repeat this every 2 to 4 hours while awake.  Do not lie on the heating pad.  Do not fall asleep while using the heating pad.  Do not use the heating pad near water. Contact with water can result in an electrical shock. SEEK MEDICAL CARE IF:  You have blisters, redness, swelling, or numbness.  You have any new problems.  Your problems are getting worse.  You have any questions or concerns. If you develop any problems, stop using heat therapy until you see your caregiver. MAKE SURE YOU:  Understand these instructions.  Will watch your condition.  Will get help right away if you are not doing well or get  worse. Document Released: 03/31/2011 Document Reviewed: 03/31/2011 [facility] Patient Information 2014 [place], [place].   Osteoarthritis Osteoarthritis is a disease that causes soreness and swelling (inflammation) of a joint. It occurs when the cartilage at the affected joint wears down. Cartilage acts as a cushion, covering the ends of bones where they meet to form a joint. Osteoarthritis is the most common form of arthritis. It often occurs in older people. The joints affected most often by this condition include those in the:  Ends of the fingers.  Thumbs.  Neck.  Lower back.  Knees.  Hips. CAUSES  Over time, the cartilage that covers the ends of bones begins to wear away. This causes bone to rub on bone, producing pain and stiffness in the affected joints.  RISK FACTORS Certain factors can increase your chances of having osteoarthritis, including:  Older age.  Excessive body weight.  Overuse of joints. SIGNS AND SYMPTOMS   Pain, swelling, and stiffness in the joint.  Over time, the joint may lose its normal shape.  Small deposits of bone (osteophytes) may grow on the edges of the joint.  Bits of bone or cartilage can break off and float inside the joint space. This may cause more pain and damage. DIAGNOSIS  Your health care provider will do a physical exam and ask about your symptoms. Various tests may be ordered, such as:  X-rays of the affected joint.  An MRI scan.  Blood tests to rule out other types of arthritis.  Joint fluid tests. This involves using a needle to draw fluid from the joint and examining the fluid under a microscope. TREATMENT  Goals of treatment are to control pain and improve joint function. Treatment plans may include:  A prescribed exercise program that allows for rest and joint relief.  A weight control plan.  Pain relief techniques, such as:  Properly applied heat and cold.  Electric pulses delivered to nerve endings under the  skin (transcutaneous electrical nerve stimulation, TENS).  Massage.  Certain nutritional supplements.  Medicines to control pain, such as:  Acetaminophen.  Nonsteroidal anti-inflammatory drugs (NSAIDs), such as naproxen.  Narcotic or central-acting agents, such as tramadol.  Corticosteroids.  These can be given orally or as an injection.  Surgery to reposition the bones and relieve pain (osteotomy) or to remove loose pieces of bone and cartilage. Joint replacement may be needed in advanced states of osteoarthritis. HOME CARE INSTRUCTIONS   Only take over-the-counter or prescription medicines as directed by your health care provider. Take all medicines exactly as instructed.  Maintain a healthy weight. Follow your health care provider's instructions for weight control. This may include dietary instructions.  Exercise as directed. Your health care provider can recommend specific types of exercise. These may include:  Strengthening exercises These are done to strengthen the muscles that support joints affected by arthritis. They can be performed with weights or with exercise bands to add resistance.  Aerobic activities These are exercises, such as brisk walking or low-impact aerobics, that get your heart pumping.  Range-of-motion activities These keep your joints limber.  Balance and agility exercises These help you maintain daily living skills.  Rest your affected joints as directed by your health care provider.  Follow up with your health care provider as directed. SEEK MEDICAL CARE IF:   Your skin turns red.  You develop a rash in addition to your joint pain.  You have worsening joint pain. SEEK IMMEDIATE MEDICAL CARE IF:  You have a significant loss of weight or appetite.  You have a fever along with joint or muscle aches.  You have night sweats. FOR MORE INFORMATION  National Institute of Arthritis and Musculoskeletal and Skin Diseases: www.niams.http://www.myers.net/ [employer] on Aging: https://walker.com/ American College of Rheumatology: www.rheumatology.org Document Released: 01/06/2005 Document Revised: 10/27/2012 Document Reviewed: 09/13/2012 [facility] Patient Information 2014 [place], [place].

## 2013-04-26 NOTE — ED Notes (Signed)
C/o pain to right knee after waking this morning.  Denies injury.

## 2013-04-26 NOTE — ED Notes (Addendum)
Pt c/o right knee pain located in front on knee area and radiates around medial area of knee, cms intact distal, / swelling, asked pt about any swelling pt states that she is unsure if knee is swollen,  denies any injury, states that she woke up with the pain this am, usually keeps her grandchildren during the day and was fine yesterday,

## 2013-04-26 NOTE — ED Notes (Signed)
Pt states that the pain is better, waiting for results of ultrasound, update given

## 2013-04-26 NOTE — ED Notes (Signed)
Dr Knapp at bedside,  

## 2014-09-22 ENCOUNTER — Emergency Department (HOSPITAL_COMMUNITY)
Admission: EM | Admit: 2014-09-22 | Discharge: 2014-09-22 | Disposition: A | Discharge status: Home / Self Care | Payer: Self-pay | Attending: Emergency Medicine | Admitting: Emergency Medicine

## 2014-09-22 ENCOUNTER — Encounter (HOSPITAL_COMMUNITY): Payer: Self-pay | Admitting: *Deleted

## 2014-09-22 DIAGNOSIS — K122 Cellulitis and abscess of mouth: Secondary | ICD-10-CM | POA: Insufficient documentation

## 2014-09-22 DIAGNOSIS — Z79899 Other long term (current) drug therapy: Secondary | ICD-10-CM | POA: Insufficient documentation

## 2014-09-22 DIAGNOSIS — Y9289 Other specified places as the place of occurrence of the external cause: Secondary | ICD-10-CM | POA: Insufficient documentation

## 2014-09-22 DIAGNOSIS — X58XXXA Exposure to other specified factors, initial encounter: Secondary | ICD-10-CM | POA: Insufficient documentation

## 2014-09-22 DIAGNOSIS — J45909 Unspecified asthma, uncomplicated: Secondary | ICD-10-CM | POA: Insufficient documentation

## 2014-09-22 DIAGNOSIS — Y9389 Activity, other specified: Secondary | ICD-10-CM | POA: Insufficient documentation

## 2014-09-22 DIAGNOSIS — I10 Essential (primary) hypertension: Secondary | ICD-10-CM | POA: Insufficient documentation

## 2014-09-22 DIAGNOSIS — Z86718 Personal history of other venous thrombosis and embolism: Secondary | ICD-10-CM | POA: Insufficient documentation

## 2014-09-22 DIAGNOSIS — M1990 Unspecified osteoarthritis, unspecified site: Secondary | ICD-10-CM | POA: Insufficient documentation

## 2014-09-22 DIAGNOSIS — T783XXA Angioneurotic edema, initial encounter: Secondary | ICD-10-CM | POA: Insufficient documentation

## 2014-09-22 DIAGNOSIS — Y998 Other external cause status: Secondary | ICD-10-CM | POA: Insufficient documentation

## 2014-09-22 MED ORDER — SODIUM CHLORIDE 0.9 % IV SOLN
INTRAVENOUS | Status: DC
  Administered 2014-09-22: 03:00:00 via INTRAVENOUS

## 2014-09-22 MED ORDER — DIPHENHYDRAMINE HCL 50 MG/ML IJ SOLN
25.0000 mg | Freq: Once | INTRAMUSCULAR | Status: AC
  Administered 2014-09-22: 25 mg via INTRAVENOUS

## 2014-09-22 MED ORDER — FAMOTIDINE IN NACL 20-0.9 MG/50ML-% IV SOLN
20.0000 mg | Freq: Once | INTRAVENOUS | Status: AC
  Administered 2014-09-22: 20 mg via INTRAVENOUS
  Filled 2014-09-22: qty 50

## 2014-09-22 MED ORDER — CETIRIZINE HCL 10 MG PO CAPS: 10.0000 mg | ORAL_CAPSULE | Freq: Every day | ORAL | Status: DC | PRN

## 2014-09-22 MED ORDER — DIPHENHYDRAMINE HCL 50 MG/ML IJ SOLN
INTRAMUSCULAR | Status: AC
  Filled 2014-09-22: qty 1

## 2014-09-22 MED ORDER — METHYLPREDNISOLONE SODIUM SUCC 125 MG IJ SOLR
125.0000 mg | Freq: Once | INTRAMUSCULAR | Status: AC
  Administered 2014-09-22: 125 mg via INTRAVENOUS
  Filled 2014-09-22: qty 2

## 2014-09-22 MED ORDER — FAMOTIDINE 20 MG PO TABS: 20.0000 mg | ORAL_TABLET | Freq: Two times a day (BID) | ORAL | Status: DC

## 2014-09-22 MED ORDER — PREDNISONE 20 MG PO TABS
ORAL_TABLET | ORAL | Status: DC
Start: 2014-09-22 — End: 2015-06-30

## 2014-09-22 NOTE — ED Provider Notes (Signed)
CSN: [id number]     Arrival date & time 09/22/14  0203 History   First MD Initiated Contact with Patient 09/22/14 0240   Chief Complaint  Patient presents with  . Foreign Body     (Consider location/radiation/quality/duration/timing/severity/associated sxs/prior Treatment) HPI patient reports she has a allergy to all fish. She states she does go to [employer] and eat fish however it makes her lips swell. She states if she eats fish that she cooks out of the grocery store it makes her lips swell. She states tonight they were eating a pepperoni pizza and she felt like something sharp scratch the right side of her throat about 5:30 PM. She states she had put the pizza in a microwave however she states it was not hot. She has eaten this pizza before. She states it got progressively worse and she feels like she's having trouble swallowing and she's having trouble breathing. She states she's never had this happen before. She denies any rash or itching in her body.  PCP none  Past Medical History  Diagnosis Date  . Arthritis   . Hypertension   . Asthma   . DVT (deep venous thrombosis)    History reviewed. No pertinent past surgical history. No family history on file. Social History  Substance Use Topics  . Smoking status: Never Smoker   . Smokeless tobacco: None  . Alcohol Use: No   Unemployed widow  OB History    No data available     Review of Systems  All other systems reviewed and are negative.     Allergies  Fish allergy  Home Medications   Prior to Admission medications   Medication Sig Start Date End Date Taking? Authorizing Provider  Cetirizine HCl (ZYRTEC ALLERGY) 10 MG CAPS Take 1 capsule (10 mg total) by mouth daily as needed (swelling). 09/22/14   Devoria Albe, MD  famotidine (PEPCID) 20 MG tablet Take 1 tablet (20 mg total) by mouth 2 (two) times daily. 09/22/14   Devoria Albe, MD  HYDROcodone-acetaminophen (NORCO/VICODIN) 5-325 MG per tablet Take 1 or 2 po Q  6hrs for pain 04/26/13   Devoria Albe, MD  ibuprofen (ADVIL,MOTRIN) 200 MG tablet Take 400 mg by mouth every 6 (six) hours as needed. For pain    Historical Provider, MD  predniSONE (DELTASONE) 20 MG tablet Take 3 po QD x 3d , then 2 po QD x 3d then 1 po QD x 3d 09/22/14   Devoria Albe, MD   BP 151/90 mmHg  Pulse 75  Temp(Src) 97.9 F (36.6 C) (Oral)  Resp 14  Ht 5\' 6"  (1.676 m)  Wt 220 lb (99.791 kg)  BMI 35.53 kg/m2  SpO2 98%  Vital signs normal   Physical Exam  Constitutional: She is oriented to person, place, and time. She appears well-developed and well-nourished.  Non-toxic appearance. She does not appear ill. No distress.  HENT:  Head: Normocephalic and atraumatic.  Right Ear: External ear normal.  Left Ear: External ear normal.  Nose: Nose normal. No mucosal edema or rhinorrhea.  Mouth/Throat: Mucous membranes are normal. No dental abscesses or uvula swelling.  Patient has mild diffuse swelling of her right posterior soft palate and the uvula. She has mild difficulty speaking. She is not drooling. She is not in respiratory distress.  Eyes: Conjunctivae and EOM are normal. Pupils are equal, round, and reactive to light.  Neck: Normal range of motion and full passive range of motion without pain. Neck supple.  Cardiovascular: Normal rate,  regular rhythm and normal heart sounds.  Exam reveals no gallop and no friction rub.   No murmur heard. Pulmonary/Chest: Effort normal and breath sounds normal. No respiratory distress. She has no wheezes. She has no rhonchi. She has no rales. She exhibits no crepitus.  Abdominal: Normal appearance.  Musculoskeletal: Normal range of motion. She exhibits no edema or tenderness.  Moves all extremities well.   Neurological: She is alert and oriented to person, place, and time. She has normal strength. No cranial nerve deficit.  Skin: Skin is warm, dry and intact. No rash noted. No erythema. No pallor.  Psychiatric: She has a normal mood and affect. Her  speech is normal and behavior is normal. Her mood appears not anxious.  Nursing note and vitals reviewed.   ED Course  Procedures (including critical care time)  Medications  0.9 %  sodium chloride infusion ( Intravenous New Bag/Given 09/22/14 0308)  methylPREDNISolone sodium succinate (SOLU-MEDROL) 125 mg/2 mL injection 125 mg (125 mg Intravenous Given 09/22/14 0307)  diphenhydrAMINE (BENADRYL) injection 25 mg (25 mg Intravenous Given 09/22/14 0307)  famotidine (PEPCID) IVPB 20 mg premix (0 mg Intravenous Stopped 09/22/14 0411)   My initial impression was possibly the pizza was hot and caused a burn, however after examining her mouth it appears to be a localized allergic type reaction. I am questioning whether when they made the pizza they had also been handling anchovies.  Recheck 4:15 AM she states she starting to feel a little bit better. When I looked she still has swelling of her soft palate and uvula. She still has some shortness of breath and trouble swallowing.  Recheck at 5:20 AM. The swelling of the soft palate is gone. There some mild swelling of the uvula still present. She states she no longer has difficulty breathing or swallowing. She feels much improved.  We discussed patient's blood pressure and she was advised to get a PCP and follow-up on her hypertension.   Labs Review Labs Reviewed - No data to display  Imaging Review No results found. I have personally reviewed and evaluated these images and lab results as part of my medical decision-making.   EKG Interpretation None      MDM   Final diagnoses:  Angioedema, initial encounter  Uvulitis    New Prescriptions   CETIRIZINE HCL (ZYRTEC ALLERGY) 10 MG CAPS    Take 1 capsule (10 mg total) by mouth daily as needed (swelling).   FAMOTIDINE (PEPCID) 20 MG TABLET    Take 1 tablet (20 mg total) by mouth 2 (two) times daily.   PREDNISONE (DELTASONE) 20 MG TABLET    Take 3 po QD x 3d , then 2 po QD x 3d then 1 po QD x 3d    Plan discharge   Devoria Albe, MD, Concha Pyo, MD 09/22/14 [phone]

## 2014-09-22 NOTE — Discharge Instructions (Signed)
Heat may make the swelling worse. Drink cold liquids and eat ice cream for comfort. Take the medications as prescribed. You can take benadryl 25 mg every 6 hrs  for swelling not controlled by the prescription medications. Return to the ED if the swelling gets worse, you have trouble breathing or are unable to swallow.    Angioedema Angioedema is sudden puffiness (swelling), often of the skin. It can happen:  On your face or privates (genitals).  In your belly (abdomen) or other body parts. It usually happens quickly and gets better in 1 or 2 days. It often starts at night and is found when you wake up. You may get red, itchy patches of skin (hives). Attacks can be dangerous if your breathing passages get puffy. The condition may happen only once, or it can come back at random times. It may happen for several years before it goes away for good. HOME CARE  Only take medicines as told by your doctor.  Always carry your emergency allergy medicines with you.  Wear a medical bracelet as told by your doctor.  Avoid things that you know will cause attacks (triggers). GET HELP IF:  You have another attack.  Your attacks happen more often or get worse.  The condition was passed to you by your parents and you want to have children. GET HELP RIGHT AWAY IF:   Your mouth, tongue, or lips are very puffy.  You have trouble breathing.  You have trouble swallowing.  You pass out (faint). MAKE SURE YOU:   Understand these instructions.  Will watch your condition.  Will get help right away if you are not doing well or get worse. Document Released: 12/25/2008 Document Revised: 10/27/2012 Document Reviewed: 08/30/2012 [facility] Patient Information 2015 [place], [place]. This information is not intended to replace advice given to you by your health care provider. Make sure you discuss any questions you have with your health care provider.  Uvulitis Uvulitis is redness and soreness  (inflammation) of the uvula. The uvula is the small tongue-shaped piece of tissue in the back of your mouth.  CAUSES Infection is a common cause of uvulitis. Infection of the uvula can be either viral or bacterial. Infectious uvulitis usually only occurs in association with another condition, such as inflammation and infection of the mouth or throat. Other causes of uvulitis include:  Trauma to the uvula.  Swelling from excess fluid buildup (edema), which may be an allergic reaction.  Inhalation of irritants, such as chemical agents, smoke, or steam. DIAGNOSIS Your caregiver can usually diagnose uvulitis through a physical examination. Bacterial uvulitis can be diagnosed through the results of the growth of samples of bodily substances taken from your mouth (cultures). HOME CARE INSTRUCTIONS   Rest as much as possible.  Young children may suck on frozen juice bars or frozen ice pops. Older children and adults may gargle with a warm or cold liquid to help soothe the throat. (Mix  tsp of salt in 8 oz of water, or use strong tea.)  Use a cool-mist humidifier to lessen throat irritation and cough.  Drink enough fluids to keep your urine clear or pale yellow.  While the throat is very sore, eat soft or liquid foods such as milk, ice cream, soups, or milk drinks.  Family members who develop a sore throat or fever should have a medical exam or throat culture.  If your child has uvulitis and is taking antibiotic medicine, wait 24 hours or until his or her temperature  is near normal (less than 100 F [37.8 C]) before allowing him or her to return to school or day care.  Only take over-the-counter or prescription medicines for pain, discomfort, or fever as directed by your caregiver. Ask when your test results will be ready. Make sure you get your test results. SEEK MEDICAL CARE IF:   You have an oral temperature above 102 F (38.9 C).  You develop large, tender lumps your the  neck.  Your child develops a rash.  You cough up green, yellow-brown, or bloody substances. SEEK IMMEDIATE MEDICAL CARE IF:   You develop any new symptoms, such as vomiting, earache, severe headache, stiff neck, chest pain, or trouble breathing or swallowing.  Your airway is blocked.  You develop more severe throat pain along with drooling or voice changes. Document Released: 08/17/2003 Document Revised: 03/31/2011 Document Reviewed: 03/14/2010 [facility] Patient Information 2015 [place], [place]. This information is not intended to replace advice given to you by your health care provider. Make sure you discuss any questions you have with your health care provider.

## 2014-09-22 NOTE — ED Notes (Signed)
Pt states that she was eating a pizza when she felt like something scratched her on right side of neck area, pt c/o swelling, pain with swallowing, trouble getting her breath, pt alert, able to maintain secretions, speech clear,

## 2015-06-30 ENCOUNTER — Encounter (HOSPITAL_COMMUNITY): Payer: Self-pay

## 2015-06-30 ENCOUNTER — Emergency Department (HOSPITAL_COMMUNITY)
Admission: EM | Admit: 2015-06-30 | Discharge: 2015-06-30 | Disposition: A | Discharge status: Home / Self Care | Payer: Medicare Other | Attending: Emergency Medicine | Admitting: Emergency Medicine

## 2015-06-30 DIAGNOSIS — Z79899 Other long term (current) drug therapy: Secondary | ICD-10-CM | POA: Diagnosis not present

## 2015-06-30 DIAGNOSIS — T7803XA Anaphylactic reaction due to other fish, initial encounter: Secondary | ICD-10-CM | POA: Diagnosis present

## 2015-06-30 DIAGNOSIS — Z791 Long term (current) use of non-steroidal anti-inflammatories (NSAID): Secondary | ICD-10-CM | POA: Diagnosis not present

## 2015-06-30 DIAGNOSIS — J45909 Unspecified asthma, uncomplicated: Secondary | ICD-10-CM | POA: Insufficient documentation

## 2015-06-30 DIAGNOSIS — I10 Essential (primary) hypertension: Secondary | ICD-10-CM | POA: Insufficient documentation

## 2015-06-30 DIAGNOSIS — T783XXA Angioneurotic edema, initial encounter: Secondary | ICD-10-CM

## 2015-06-30 DIAGNOSIS — M1990 Unspecified osteoarthritis, unspecified site: Secondary | ICD-10-CM | POA: Insufficient documentation

## 2015-06-30 MED ORDER — PREDNISONE 20 MG PO TABS
40.0000 mg | ORAL_TABLET | Freq: Once | ORAL | Status: AC
  Administered 2015-06-30: 40 mg via ORAL
  Filled 2015-06-30: qty 2

## 2015-06-30 MED ORDER — FAMOTIDINE 20 MG PO TABS
20.0000 mg | ORAL_TABLET | Freq: Once | ORAL | Status: AC
  Administered 2015-06-30: 20 mg via ORAL
  Filled 2015-06-30: qty 1

## 2015-06-30 MED ORDER — PREDNISONE 20 MG PO TABS: ORAL_TABLET | ORAL | Status: DC

## 2015-06-30 NOTE — ED Provider Notes (Signed)
CSN: [id number]     Arrival date & time 06/30/15  0703 History   First MD Initiated Contact with Patient 06/30/15 [phone]     Chief Complaint  Patient presents with  . Allergic Reaction     (Consider location/radiation/quality/duration/timing/severity/associated sxs/prior Treatment) Patient is a 65 y.o. female presenting with allergic reaction.  Allergic Reaction Presenting symptoms: swelling   Severity:  Mild Context: no animal exposure and no food allergies   Relieved by:  None tried Worsened by:  Nothing tried Ineffective treatments:  Antihistamines   Past Medical History  Diagnosis Date  . Arthritis   . Hypertension   . Asthma   . DVT (deep venous thrombosis) (HCC)    History reviewed. No pertinent past surgical history. No family history on file. Social History  Substance Use Topics  . Smoking status: Never Smoker   . Smokeless tobacco: None  . Alcohol Use: No   OB History    No data available     Review of Systems  Constitutional: Negative for fever and chills.  HENT: Negative for ear pain.   Eyes: Negative for pain.  Respiratory: Negative for cough and shortness of breath.   Cardiovascular: Negative for chest pain.  Gastrointestinal: Negative for nausea, vomiting, abdominal pain and diarrhea.  Endocrine: Negative for heat intolerance, polydipsia and polyuria.  Genitourinary: Negative for dysuria.  All other systems reviewed and are negative.     Allergies  Fish allergy  Home Medications   Prior to Admission medications   Medication Sig Start Date End Date Taking? Authorizing Provider  diphenhydrAMINE (BENADRYL) 25 mg capsule Take 25 mg by mouth every 6 (six) hours as needed for allergies.   Yes Historical Provider, MD  ibuprofen (ADVIL,MOTRIN) 200 MG tablet Take 400 mg by mouth every 6 (six) hours as needed. Reported on 06/30/2015    Historical Provider, MD  predniSONE (DELTASONE) 20 MG tablet 3 tabs po daily x 3 days, then 2 tabs x 3 days, then 1.5  tabs x 3 days, then 1 tab x 3 days, then 0.5 tabs x 3 days 06/30/15   Marily MemosJason Addilyn Satterwhite, MD   BP 147/97 mmHg  Pulse 68  Temp(Src) 98.3 F (36.8 C) (Oral)  Resp 18  Ht 5\' 6"  (1.676 m)  Wt 220 lb (99.791 kg)  BMI 35.53 kg/m2  SpO2 98% Physical Exam  Constitutional: She appears well-developed and well-nourished.  HENT:  Head: Normocephalic and atraumatic.  Upper lip swelling extending to cheeks, no tongue, neck or intraoral swelling noted.   Neck: Normal range of motion.  Cardiovascular: Normal rate and regular rhythm.   Pulmonary/Chest: No stridor. No respiratory distress.  Abdominal: She exhibits no distension.  Musculoskeletal: She exhibits no edema or tenderness.  Neurological: She is alert.  Nursing note and vitals reviewed.   ED Course  Procedures (including critical care time) Labs Review Labs Reviewed - No data to display  Imaging Review No results found. I have personally reviewed and evaluated these images and lab results as part of my medical decision-making.   EKG Interpretation None      MDM   Final diagnoses:  Angioedema, initial encounter   Angioedema. Unsure of cause (did have small piece of fish a few days ago to which she is alelrgic). Not on any medications taht would cause it. No e/o anaphylaxis or airway involvement at this time. Will start with steroids, pepcid.  Reevaluated at 0831: no change in angioedema Reevaluated at 1118: significant improvement of angioedema, no worsening symptoms.  Plan for discharge on steroid taper. Avoid allergens.   New Prescriptions: Discharge Medication List as of 06/30/2015 11:19 AM       I have personally and contemperaneously reviewed labs and imaging and used in my decision making as above.   A medical screening exam was performed and I feel the patient has had an appropriate workup for their chief complaint at this time and likelihood of emergent condition existing is low and thus workup can continue on an  outpatient basis.. Their vital signs are stable. They have been counseled on decision, discharge, follow up and which symptoms necessitate immediate return to the emergency department.  They verbally stated understanding and agreement with plan and discharged in stable condition.      Marily Memos, MD 06/30/15 [phone]

## 2015-06-30 NOTE — ED Notes (Signed)
Pt reports is allergic to seafood and ate a small bite of fish 2 days ago and mouth started swelling.  Pt says the swelling is spreading.  Denies any difficulty breathing or swallowing.

## 2015-09-23 ENCOUNTER — Emergency Department (HOSPITAL_COMMUNITY)
Admission: EM | Admit: 2015-09-23 | Discharge: 2015-09-23 | Disposition: A | Discharge status: Home / Self Care | Payer: Medicare Other | Attending: Emergency Medicine | Admitting: Emergency Medicine

## 2015-09-23 ENCOUNTER — Encounter (HOSPITAL_COMMUNITY): Payer: Self-pay | Admitting: *Deleted

## 2015-09-23 DIAGNOSIS — T7840XA Allergy, unspecified, initial encounter: Secondary | ICD-10-CM | POA: Diagnosis present

## 2015-09-23 DIAGNOSIS — J45909 Unspecified asthma, uncomplicated: Secondary | ICD-10-CM | POA: Diagnosis not present

## 2015-09-23 DIAGNOSIS — I10 Essential (primary) hypertension: Secondary | ICD-10-CM | POA: Diagnosis not present

## 2015-09-23 DIAGNOSIS — T783XXA Angioneurotic edema, initial encounter: Secondary | ICD-10-CM | POA: Diagnosis not present

## 2015-09-23 DIAGNOSIS — Z79899 Other long term (current) drug therapy: Secondary | ICD-10-CM | POA: Diagnosis not present

## 2015-09-23 DIAGNOSIS — Z791 Long term (current) use of non-steroidal anti-inflammatories (NSAID): Secondary | ICD-10-CM | POA: Diagnosis not present

## 2015-09-23 MED ORDER — METHYLPREDNISOLONE SODIUM SUCC 125 MG IJ SOLR
125.0000 mg | Freq: Once | INTRAMUSCULAR | Status: AC
  Administered 2015-09-23: 125 mg via INTRAVENOUS
  Filled 2015-09-23: qty 2

## 2015-09-23 MED ORDER — FAMOTIDINE IN NACL 20-0.9 MG/50ML-% IV SOLN
20.0000 mg | Freq: Once | INTRAVENOUS | Status: AC
  Administered 2015-09-23: 20 mg via INTRAVENOUS
  Filled 2015-09-23: qty 50

## 2015-09-23 MED ORDER — DIPHENHYDRAMINE HCL 50 MG/ML IJ SOLN
25.0000 mg | Freq: Once | INTRAMUSCULAR | Status: AC
  Administered 2015-09-23: 25 mg via INTRAVENOUS
  Filled 2015-09-23: qty 1

## 2015-09-23 MED ORDER — PREDNISONE 20 MG PO TABS: ORAL_TABLET | ORAL | 0 refills | Status: DC

## 2015-09-23 NOTE — ED Triage Notes (Signed)
Pt states that she woke up this am with her tongue swelling, denies any sob,

## 2015-09-23 NOTE — ED Notes (Addendum)
Pt states she feels like her tongue is still swollen.

## 2015-09-23 NOTE — Discharge Instructions (Signed)
Angioedema can be triggered by an allergic reaction to: certain types of food - particularly nuts, shellfish, milk and eggs some types of medication - including some antibiotics, aspirin and non-steroidal anti-inflammatory drugs (NSAIDs), such as ibuprofen insect bites and stings - particularly wasp and bee stings latex - a type of rubber used to make medical gloves, balloons and condoms  Medications that can cause angioedema include: angiotensin-converting enzyme (ACE) inhibitors, such as enalapril, lisinopril, perindopril and ramipril, which are used to treat high blood pressure  ibuprofen and other types of NSAID painkillers  angiotensin-2 receptor blockers (ARBs), such as andesartan, irbesartan, losartan, valsartan and olmesartan - another medication used to treat high blood pressure

## 2015-09-23 NOTE — ED Notes (Signed)
Pt states she is better.  States her tongue feels like it is getting smaller.  No visible distress.

## 2015-09-23 NOTE — ED Provider Notes (Signed)
[facility]-EMERGENCY DEPT Provider Note   CSN: [id number] Arrival date & time: 09/23/15  0507     History   Chief Complaint Chief Complaint  Patient presents with  . Allergic Reaction    HPI Cristina Munoz is a 65 y.o. female.  Woke up about half an hour ago with tongue swelling similar to previous episodes. No other complaints. No contact with allergens. No light headedness, abdominal pain, sob or other associated symptoms.       Past Medical History:  Diagnosis Date  . Arthritis   . Asthma   . DVT (deep venous thrombosis) (HCC)   . Hypertension     Patient Active Problem List   Diagnosis Date Noted  . LOWER LEG, ARTHRITIS, DEGEN./OSTEO 03/30/2007  . KNEE PAIN 03/30/2007  . DISC DEGENERATION 03/30/2007  . BACK PAIN 03/30/2007    History reviewed. No pertinent surgical history.  OB History    No data available       Home Medications    Prior to Admission medications   Medication Sig Start Date End Date Taking? Authorizing Provider  diphenhydrAMINE (BENADRYL) 25 mg capsule Take 25 mg by mouth every 6 (six) hours as needed for allergies.   Yes Historical Provider, MD  ibuprofen (ADVIL,MOTRIN) 200 MG tablet Take 400 mg by mouth every 6 (six) hours as needed. Reported on 06/30/2015   Yes Historical Provider, MD  predniSONE (DELTASONE) 20 MG tablet 2 tabs po daily x 4 days 09/23/15   Marily Memos, MD    Family History No family history on file.  Social History Social History  Substance Use Topics  . Smoking status: Never Smoker  . Smokeless tobacco: Never Used  . Alcohol use No     Allergies   Fish allergy   Review of Systems Review of Systems  HENT: Negative for congestion, dental problem, drooling and facial swelling.        Tongue swelling  Eyes: Negative for pain.  Endocrine: Negative for polydipsia and polyuria.  All other systems reviewed and are negative.    Physical Exam Updated Vital Signs BP 124/57   Pulse 69   Temp 98 F (36.7 C)    Resp 18   Ht 5\' 8"  (1.727 m)   Wt 230 lb (104.3 kg)   SpO2 98%   BMI 34.97 kg/m   Physical Exam  Constitutional: She appears well-developed and well-nourished. No distress.  HENT:  Head: Normocephalic and atraumatic.  Mild tongue swelling  Eyes: Conjunctivae are normal.  Neck: Neck supple.  Cardiovascular: Normal rate and regular rhythm.   No murmur heard. Pulmonary/Chest: Effort normal and breath sounds normal. No respiratory distress.  Abdominal: Soft. There is no tenderness.  Musculoskeletal: She exhibits no edema.  Neurological: She is alert.  Skin: Skin is warm and dry.  Psychiatric: She has a normal mood and affect.  Nursing note and vitals reviewed.    ED Treatments / Results  Labs (all labs ordered are listed, but only abnormal results are displayed) Labs Reviewed - No data to display  EKG  EKG Interpretation None       Radiology No results found.  Procedures Procedures (including critical care time)  Medications Ordered in ED Medications  methylPREDNISolone sodium succinate (SOLU-MEDROL) 125 mg/2 mL injection 125 mg (125 mg Intravenous Given 09/23/15 0532)  famotidine (PEPCID) IVPB 20 mg premix (0 mg Intravenous Stopped 09/23/15 0626)  diphenhydrAMINE (BENADRYL) injection 25 mg (25 mg Intravenous Given 09/23/15 0918)     Initial Impression /  Assessment and Plan / ED Course  I have reviewed the triage vital signs and the nursing notes.  Pertinent labs & imaging results that were available during my care of the patient were reviewed by me and considered in my medical decision making (see chart for details).  Clinical Course  Comment By Time  Reevaluated, swelling no worse. Will need obs until 0900.  Marily MemosJason Raigan Baria, MD 09/03 [phone]   Angioedema, possibly related to increased ibuprofen usage over last few days.  No e/o anaphylaxis.  Will give steroids, pepcid, observe.  After multiple reevaluations, no worsening. Plan for obs until 0900, transfer of care to  complete 4 hours obs.   Final Clinical Impressions(s) / ED Diagnoses   Final diagnoses:  Angioedema, initial encounter  Allergic reaction, initial encounter    New Prescriptions Discharge Medication List as of 09/23/2015  7:22 AM       Marily MemosJason [place] Creswell, MD 09/25/15 1219

## 2016-10-04 ENCOUNTER — Inpatient Hospital Stay (HOSPITAL_COMMUNITY)
Admission: EM | Admit: 2016-10-04 | Discharge: 2016-10-07 | DRG: 176 | Disposition: A | Discharge status: Home / Self Care | Payer: Medicare Other | Attending: Internal Medicine | Admitting: Internal Medicine

## 2016-10-04 ENCOUNTER — Emergency Department (HOSPITAL_COMMUNITY): Payer: Medicare Other

## 2016-10-04 ENCOUNTER — Encounter (HOSPITAL_COMMUNITY): Payer: Self-pay | Admitting: *Deleted

## 2016-10-04 DIAGNOSIS — R52 Pain, unspecified: Secondary | ICD-10-CM | POA: Diagnosis not present

## 2016-10-04 DIAGNOSIS — I2699 Other pulmonary embolism without acute cor pulmonale: Secondary | ICD-10-CM | POA: Diagnosis not present

## 2016-10-04 DIAGNOSIS — M1990 Unspecified osteoarthritis, unspecified site: Secondary | ICD-10-CM | POA: Diagnosis present

## 2016-10-04 DIAGNOSIS — R0781 Pleurodynia: Secondary | ICD-10-CM

## 2016-10-04 DIAGNOSIS — R061 Stridor: Secondary | ICD-10-CM | POA: Diagnosis not present

## 2016-10-04 DIAGNOSIS — R55 Syncope and collapse: Secondary | ICD-10-CM

## 2016-10-04 DIAGNOSIS — I2609 Other pulmonary embolism with acute cor pulmonale: Secondary | ICD-10-CM | POA: Diagnosis not present

## 2016-10-04 DIAGNOSIS — I9589 Other hypotension: Secondary | ICD-10-CM | POA: Diagnosis not present

## 2016-10-04 DIAGNOSIS — J45909 Unspecified asthma, uncomplicated: Secondary | ICD-10-CM | POA: Diagnosis not present

## 2016-10-04 DIAGNOSIS — Z86718 Personal history of other venous thrombosis and embolism: Secondary | ICD-10-CM

## 2016-10-04 DIAGNOSIS — I10 Essential (primary) hypertension: Secondary | ICD-10-CM | POA: Diagnosis not present

## 2016-10-04 DIAGNOSIS — I959 Hypotension, unspecified: Secondary | ICD-10-CM | POA: Diagnosis not present

## 2016-10-04 DIAGNOSIS — I361 Nonrheumatic tricuspid (valve) insufficiency: Secondary | ICD-10-CM | POA: Diagnosis not present

## 2016-10-04 DIAGNOSIS — R079 Chest pain, unspecified: Secondary | ICD-10-CM | POA: Diagnosis not present

## 2016-10-04 LAB — COMPREHENSIVE METABOLIC PANEL
ALT: 7 U/L — ABNORMAL LOW (ref 14–54)
AST: 12 U/L — AB (ref 15–41)
Albumin: 3.5 g/dL (ref 3.5–5.0)
Alkaline Phosphatase: 88 U/L (ref 38–126)
Anion gap: 6 (ref 5–15)
BUN: 11 mg/dL (ref 6–20)
CHLORIDE: 107 mmol/L (ref 101–111)
CO2: 29 mmol/L (ref 22–32)
Calcium: 9 mg/dL (ref 8.9–10.3)
Creatinine, Ser: 0.72 mg/dL (ref 0.44–1.00)
GFR calc Af Amer: >60 mL/min (ref >60)
Glucose, Bld: 111 mg/dL — ABNORMAL HIGH (ref 65–99)
POTASSIUM: 4 mmol/L (ref 3.5–5.1)
SODIUM: 142 mmol/L (ref 135–145)
Total Bilirubin: 0.5 mg/dL (ref 0.3–1.2)
Total Protein: 7 g/dL (ref 6.5–8.1)

## 2016-10-04 LAB — CBC WITH DIFFERENTIAL/PLATELET
BASOS ABS: 0 10*3/uL (ref 0.0–0.1)
Basophils Relative: 0 %
Eosinophils Absolute: 0.1 10*3/uL (ref 0.0–0.7)
Eosinophils Relative: 2 %
HEMATOCRIT: 35 % — AB (ref 36.0–46.0)
Hemoglobin: 11.4 g/dL — ABNORMAL LOW (ref 12.0–15.0)
LYMPHS PCT: 29 %
Lymphs Abs: 2 10*3/uL (ref 0.7–4.0)
MCH: 28.4 pg (ref 26.0–34.0)
MCHC: 32.6 g/dL (ref 30.0–36.0)
MCV: 87.3 fL (ref 78.0–100.0)
Monocytes Absolute: 0.4 10*3/uL (ref 0.1–1.0)
Monocytes Relative: 5 %
NEUTROS ABS: 4.5 10*3/uL (ref 1.7–7.7)
Neutrophils Relative %: 64 %
Platelets: 208 10*3/uL (ref 150–400)
RBC: 4.01 MIL/uL (ref 3.87–5.11)
RDW: 14.6 % (ref 11.5–15.5)
WBC: 6.9 10*3/uL (ref 4.0–10.5)

## 2016-10-04 LAB — LIPASE, BLOOD: LIPASE: 24 U/L (ref 11–51)

## 2016-10-04 LAB — URINALYSIS, ROUTINE W REFLEX MICROSCOPIC
Bilirubin Urine: NEGATIVE
Glucose, UA: NEGATIVE mg/dL
Hgb urine dipstick: NEGATIVE
Ketones, ur: NEGATIVE mg/dL
LEUKOCYTES UA: NEGATIVE
NITRITE: NEGATIVE
Protein, ur: NEGATIVE mg/dL
SPECIFIC GRAVITY, URINE: 1.015 (ref 1.005–1.030)
pH: 8 (ref 5.0–8.0)

## 2016-10-04 LAB — HEPARIN LEVEL (UNFRACTIONATED): Heparin Unfractionated: 0.88 IU/mL — ABNORMAL HIGH (ref 0.30–0.70)

## 2016-10-04 LAB — TROPONIN I

## 2016-10-04 LAB — I-STAT TROPONIN, ED: Troponin i, poc: 0 ng/mL (ref 0.00–0.08)

## 2016-10-04 LAB — PROTIME-INR
INR: 1.05
PROTHROMBIN TIME: 13.6 s (ref 11.4–15.2)

## 2016-10-04 MED ORDER — ONDANSETRON HCL 4 MG PO TABS: 4.0000 mg | ORAL_TABLET | Freq: Four times a day (QID) | ORAL | Status: DC | PRN

## 2016-10-04 MED ORDER — HEPARIN (PORCINE) IN NACL 100-0.45 UNIT/ML-% IJ SOLN
1400.0000 [IU]/h | INTRAMUSCULAR | Status: DC
  Administered 2016-10-04: 1350 [IU]/h via INTRAVENOUS
  Administered 2016-10-05 – 2016-10-06 (×2): 1200 [IU]/h via INTRAVENOUS
  Filled 2016-10-04 (×3): qty 250

## 2016-10-04 MED ORDER — HEPARIN BOLUS VIA INFUSION
4000.0000 [IU] | Freq: Once | INTRAVENOUS | Status: AC
  Administered 2016-10-04: 4000 [IU] via INTRAVENOUS

## 2016-10-04 MED ORDER — ONDANSETRON HCL 4 MG/2ML IJ SOLN: 4.0000 mg | Freq: Four times a day (QID) | INTRAMUSCULAR | Status: DC | PRN

## 2016-10-04 MED ORDER — ONDANSETRON HCL 4 MG/2ML IJ SOLN
4.0000 mg | Freq: Once | INTRAMUSCULAR | Status: AC
  Administered 2016-10-04: 4 mg via INTRAVENOUS
  Filled 2016-10-04: qty 2

## 2016-10-04 MED ORDER — FENTANYL CITRATE (PF) 100 MCG/2ML IJ SOLN
50.0000 ug | Freq: Once | INTRAMUSCULAR | Status: AC
  Administered 2016-10-04: 50 ug via INTRAVENOUS
  Filled 2016-10-04: qty 2

## 2016-10-04 MED ORDER — OXYCODONE-ACETAMINOPHEN 5-325 MG PO TABS
1.0000 | ORAL_TABLET | ORAL | Status: DC | PRN
  Administered 2016-10-04: 1 via ORAL
  Filled 2016-10-04: qty 1

## 2016-10-04 MED ORDER — ACETAMINOPHEN 650 MG RE SUPP: 650.0000 mg | Freq: Four times a day (QID) | RECTAL | Status: DC | PRN

## 2016-10-04 MED ORDER — FENTANYL CITRATE (PF) 100 MCG/2ML IJ SOLN
25.0000 ug | INTRAMUSCULAR | Status: DC | PRN
  Administered 2016-10-04: 25 ug via INTRAVENOUS
  Filled 2016-10-04: qty 2

## 2016-10-04 MED ORDER — ACETAMINOPHEN 325 MG PO TABS: 650.0000 mg | ORAL_TABLET | Freq: Four times a day (QID) | ORAL | Status: DC | PRN

## 2016-10-04 MED ORDER — IOPAMIDOL (ISOVUE-370) INJECTION 76%
100.0000 mL | Freq: Once | INTRAVENOUS | Status: AC | PRN
  Administered 2016-10-04: 100 mL via INTRAVENOUS

## 2016-10-04 MED ORDER — SODIUM CHLORIDE 0.9 % IV BOLUS (SEPSIS)
500.0000 mL | Freq: Once | INTRAVENOUS | Status: AC
  Administered 2016-10-04: 500 mL via INTRAVENOUS

## 2016-10-04 NOTE — ED Provider Notes (Signed)
Emergency Department Provider Note   I have reviewed the triage vital signs and the nursing notes.   HISTORY  Chief Complaint Abdominal Pain   HPI Cristina Munoz is a 66 y.o. female with PMH of DVT no longer on anticoagulation, asthma, and HTN presents emergency department for evaluation of sudden onset left chest wall and flank pain. Patient had mild discomfort yesterday which seemed to dissipate throughout the evening. Today she developed a sharp, more severe pain in the left lateral chest wall seemed higher up than her prior pain. Pain is worse with movement. She denies any dyspnea. No fevers or chills. Denies injury to the chest wall. Denies any dysuria, hesitancy, urgency, or hematuria.   Past Medical History:  Diagnosis Date  . Arthritis   . Asthma   . DVT (deep venous thrombosis) (HCC)   . Hypertension     Patient Active Problem List   Diagnosis Date Noted  . LOWER LEG, ARTHRITIS, DEGEN./OSTEO 03/30/2007  . KNEE PAIN 03/30/2007  . DISC DEGENERATION 03/30/2007  . BACK PAIN 03/30/2007    History reviewed. No pertinent surgical history.  Current Outpatient Rx  . Order #: [id number] Class: Historical Med    Allergies Fish allergy and Tomato  No family history on file.  Social History Social History  Substance Use Topics  . Smoking status: Never Smoker  . Smokeless tobacco: Never Used  . Alcohol use No    Review of Systems  Constitutional: No fever/chills Eyes: No visual changes. ENT: No sore throat. Cardiovascular: Positive chest pain. Respiratory: Denies shortness of breath. Gastrointestinal: No abdominal pain.  No nausea, no vomiting.  No diarrhea.  No constipation. Genitourinary: Negative for dysuria. Musculoskeletal: Negative for back pain. Skin: Negative for rash. Neurological: Negative for headaches, focal weakness or numbness.  10-point ROS otherwise negative.  ____________________________________________   PHYSICAL EXAM:  VITAL  SIGNS: ED Triage Vitals  Enc Vitals Group     BP 10/04/16 1150 (!) 155/87     Pulse Rate 10/04/16 1150 67     Resp 10/04/16 1150 18     Temp 10/04/16 1150 98.1 F (36.7 C)     Temp Source 10/04/16 1150 Oral     SpO2 10/04/16 1150 97 %     Weight 10/04/16 1148 230 lb (104.3 kg)     Height 10/04/16 1148  (1.702 m)     Pain Score 10/04/16 1147 10   Constitutional: Alert and oriented. Tearful and appears uncomfortable.  Eyes: Conjunctivae are normal. Head: Atraumatic. Nose: No congestion/rhinnorhea. Mouth/Throat: Mucous membranes are moist.  Neck: No stridor.  Cardiovascular: Normal rate, regular rhythm. Good peripheral circulation. Grossly normal heart sounds.   Respiratory: Normal respiratory effort.  No retractions. Lungs CTAB. Gastrointestinal: Soft and nontender. No distention.  Musculoskeletal: No lower extremity tenderness nor edema. No gross deformities of extremities. Positive tenderness to palpation of the left, lateral chest wall.  Neurologic:  Normal speech and language. No gross focal neurologic deficits are appreciated.  Skin:  Skin is warm, dry and intact. No rash noted.  ____________________________________________   LABS (all labs ordered are listed, but only abnormal results are displayed)  Labs Reviewed  COMPREHENSIVE METABOLIC PANEL - Abnormal; Notable for the following:       Result Value   Glucose, Bld 111 (*)    AST 12 (*)    ALT 7 (*)    All other components within normal limits  CBC WITH DIFFERENTIAL/PLATELET - Abnormal; Notable for the following:    Hemoglobin  11.4 (*)    HCT 35.0 (*)    All other components within normal limits  LIPASE, BLOOD  URINALYSIS, ROUTINE W REFLEX MICROSCOPIC  PROTIME-INR  HEPARIN LEVEL (UNFRACTIONATED)  I-STAT TROPONIN, ED   ____________________________________________  EKG   EKG Interpretation  Date/Time:  Saturday October 04 2016 12:23:16 EDT Ventricular Rate:  64 PR Interval:    QRS Duration: 89 QT  Interval:  395 QTC Calculation: 408 R Axis:   -6 Text Interpretation:  Sinus rhythm No STEMI.  Similar to prior.  Confirmed by Alona Bene [phone]) on 10/04/2016 12:36:40 PM       ____________________________________________  RADIOLOGY  Ct Angio Chest Pe W And/or Wo Contrast  Result Date: 10/04/2016 CLINICAL DATA:  66 year old female with a history of left-sided abdominal rib pain. EXAM: CT ANGIOGRAPHY CHEST WITH CONTRAST TECHNIQUE: Multidetector CT imaging of the chest was performed using the standard protocol during bolus administration of intravenous contrast. Multiplanar CT image reconstructions and MIPs were obtained to evaluate the vascular anatomy. CONTRAST:  100 cc Isovue 370 COMPARISON:  12/20/2011 FINDINGS: Cardiovascular: Heart: No cardiomegaly. No pericardial fluid/thickening. No significant coronary calcifications. Diameter of right ventricle to left ventricle measures less than 1. No inversion of the interventricular septum. No enlarged right ventricle. No reflux of contrast into the hepatic IVC. Aorta: Unremarkable course, caliber, contour of the thoracic aorta. No aneurysm or dissection flap. No periaortic fluid. Minimal calcifications of the aortic arch. Branch vessels remain patent. Pulmonary arteries: Filling defects of left-sided segmental, subsegmental vessels of the left upper lobe and right upper lobe. No main pulmonary artery filling defect. No right lobar filling defects. No right-sided segmental or subsegmental emboli identified. Trace left pleural effusion. Mediastinum/Nodes: No mediastinal adenopathy. Unremarkable appearance of the thoracic inlet and thyroid. Lungs/Pleura: Mixed ground-glass and nodular opacity of the left lower lobe, an the distribution related to pulmonary emboli. Small pleural effusion. No pneumothorax. Upper Abdomen: Unremarkable. Musculoskeletal: No acute displaced fracture. There are small lucent lesions within the vertebral bodies of T2 and T3. Lucent  lesion of the spinous process of T5. No fracture identified. Review of the MIP images confirms the above findings. IMPRESSION: Study is positive for left-sided pulmonary embolism involving segmental and subsegmental vessels. No evidence of right-sided heart strain. Mixed in linear and nodular changes of the left lower lobe, compatible with consolidation in the distribution of affected pulmonary vessels. Trace reactive left pleural effusion. Small lucent lesions within the vertebral body of T2 and T3, and the posterior elements of T5. These are nonspecific, though could represent lytic lesions in the setting of multiple on myeloma or other metastatic disease. Correlation with lab values and potentially bone scan may be useful if there is concern for malignancy as a risk factors for the patient's pulmonary embolism. Results called by telephone at the time of interpretation on 10/04/2016 at 2:30 pm to Dr. Alona Bene , who verbally acknowledged these results. Electronically Signed   By: Gilmer Mor D.O.   On: 10/04/2016 14:33   Dg Abdomen Acute W/chest  Result Date: 10/04/2016 CLINICAL DATA:  Chest pain and flank pain EXAM: DG ABDOMEN ACUTE W/ 1V CHEST COMPARISON:  Chest CT 12/20/2011 Chest radiograph 12/20/2011 FINDINGS: Lungs are well inflated without focal consolidation. Mild pulmonary vascular congestion without overt pulmonary edema. Cardiomediastinal contours are normal. No pleural effusion or pneumothorax. No free intraperitoneal air. There is a calcified fibroid in the right hemipelvis that measures approximately 4.6 cm. There are no calcifications overlying the renal shadows or the expected courses  of the ureters. IMPRESSION: 1. Mild pulmonary vascular congestion without overt edema. 2. No free intraperitoneal air. 3. No visible nephroureterolithiasis. Electronically Signed   By: Deatra Robinson M.D.   On: 10/04/2016 13:00    ____________________________________________   PROCEDURES  Procedure(s)  performed:   Procedures  CRITICAL CARE Performed by: Maia Plan Total critical care time: 35 minutes Critical care time was exclusive of separately billable procedures and treating other patients. Critical care was necessary to treat or prevent imminent or life-threatening deterioration. Critical care was time spent personally by me on the following activities: development of treatment plan with patient and/or surrogate as well as nursing, discussions with consultants, evaluation of patient's response to treatment, examination of patient, obtaining history from patient or surrogate, ordering and performing treatments and interventions, ordering and review of laboratory studies, ordering and review of radiographic studies, pulse oximetry and re-evaluation of patient's condition.  Alona Bene, MD Emergency Medicine  ____________________________________________   INITIAL IMPRESSION / ASSESSMENT AND PLAN / ED COURSE  Pertinent labs & imaging results that were available during my care of the patient were reviewed by me and considered in my medical decision making (see chart for details).  Patient resents emergency pertinent for evaluation of left chest wall pain primarily. She has no abdominal discomfort. She has tenderness to palpation of the left lateral chest wall. Her area of discomfort is to transition point between the chest and abdomen. Plan to start with plain films of both areas. Patient has a, locating history of prior DVT who stopped taking anticoagulation after no longer going to PCP.   Spoke with Radiology regarding the CTA results. Updated patient. She was on Coumadin many years prior and stopped after not following up with PCP any longer. Unknown is PE at that time was provoked or not. Will start Heparin infusion and admit for pain control and monitoring/anticoagulation.   Discussed patient's case with Hospitalist, Dr. Arbutus Leas to request admission. Patient and family (if present)  updated with plan. Care transferred to Hospitalist service.  I reviewed all nursing notes, vitals, pertinent old records, EKGs, labs, imaging (as available).  ____________________________________________  FINAL CLINICAL IMPRESSION(S) / ED DIAGNOSES  Final diagnoses:  Other acute pulmonary embolism without acute cor pulmonale (HCC)     MEDICATIONS GIVEN DURING THIS VISIT:  Medications  heparin bolus via infusion 4,000 Units (not administered)    Followed by  heparin ADULT infusion 100 units/mL (25000 units/224mL sodium chloride 0.45%) (not administered)  sodium chloride 0.9 % bolus 500 mL (500 mLs Intravenous New Bag/Given 10/04/16 1216)  fentaNYL (SUBLIMAZE) injection 50 mcg (50 mcg Intravenous Given 10/04/16 1216)  ondansetron (ZOFRAN) injection 4 mg (4 mg Intravenous Given 10/04/16 1216)  iopamidol (ISOVUE-370) 76 % injection 100 mL (100 mLs Intravenous Contrast Given 10/04/16 1408)     NEW OUTPATIENT MEDICATIONS STARTED DURING THIS VISIT:  None   Note:  This document was prepared using Dragon voice recognition software and may include unintentional dictation errors.  Alona Bene, MD Emergency Medicine    Nialah Saravia, Arlyss Repress, MD 10/04/16 [phone]

## 2016-10-04 NOTE — Progress Notes (Signed)
ANTICOAGULATION CONSULT NOTE - follow up  Pharmacy Consult for Heparin Indication: pulmonary embolus  Allergies  Allergen Reactions  . Fish Allergy Other (See Comments)    Mouth and lips swell  . Tomato Swelling    Patient Measurements: Height:  (170.2 cm) Weight: 239 lb 6.7 oz (108.6 kg) IBW/kg (Calculated) : 61.6 HEPARIN DW (KG): 86.5  Vital Signs: Temp: 98.7 F (37.1 C) (09/15 2118) Temp Source: Oral (09/15 2118) BP: 135/69 (09/15 2118) Pulse Rate: 76 (09/15 2118)  Labs:  Recent Labs  10/04/16 1305 10/04/16 1718 10/04/16 2048  HGB 11.4*  --   --   HCT 35.0*  --   --   PLT 208  --   --   LABPROT 13.6  --   --   INR 1.05  --   --   HEPARINUNFRC  --   --  0.88*  CREATININE 0.72  --   --   TROPONINI  --  <0.03  --     Estimated Creatinine Clearance: 86.6 mL/min (by C-G formula based on SCr of 0.72 mg/dL).   Medical History: Past Medical History:  Diagnosis Date  . Arthritis   . Asthma   . DVT (deep venous thrombosis) (HCC)   . Hypertension     Medications:  See med rec  Assessment: 66 yo female brought into ED with left sided abdomina/rib pain that was worse with movement and breathing. CT scan shows left -sided PE. Plan to start heparin. Heparin level is elevated at 0.88.  Goal of Therapy:  Heparin level 0.3-0.7 units/ml Monitor platelets by anticoagulation protocol: Yes   Plan:  Decrease heparin infusion at 1200 units/hr Check anti-Xa level in 6-8 hours and daily while on heparin Continue to monitor H&H and platelets  Elder Cyphers, BS Loura Back, BCPS Clinical Pharmacist Pager [phone] 10/04/2016,10:11 PM

## 2016-10-04 NOTE — ED Triage Notes (Signed)
Pt brought in by [facility] with c/o left sided abdominal/rib pain, worse with movement and breathing that started yesterday. Denies injury.

## 2016-10-04 NOTE — Progress Notes (Signed)
ANTICOAGULATION CONSULT NOTE - Initial Consult  Pharmacy Consult for Heparin Indication: pulmonary embolus  Allergies  Allergen Reactions  . Fish Allergy Other (See Comments)    Mouth and lips swell  . Tomato Swelling    Patient Measurements: Height:  (170.2 cm) Weight: 230 lb (104.3 kg) IBW/kg (Calculated) : 61.6 HEPARIN DW (KG): 85.2  Vital Signs: Temp: 98.1 F (36.7 C) (09/15 1150) Temp Source: Oral (09/15 1150) BP: 140/92 (09/15 1437) Pulse Rate: 71 (09/15 1437)  Labs:  Recent Labs  10/04/16 1305  HGB 11.4*  HCT 35.0*  PLT 208  CREATININE 0.72    Estimated Creatinine Clearance: 84.8 mL/min (by C-G formula based on SCr of 0.72 mg/dL).   Medical History: Past Medical History:  Diagnosis Date  . Arthritis   . Asthma   . DVT (deep venous thrombosis) (HCC)   . Hypertension     Medications:  See med rec  Assessment: 66 yo female brought into ED with left sided abdomina/rib pain that was worse with movement and breathing. CT scan shows left -sided PE. Plan to start heparin.   Goal of Therapy:  Heparin level 0.3-0.7 units/ml Monitor platelets by anticoagulation protocol: Yes   Plan:  Give 4000 units bolus x 1 Start heparin infusion at 1350 units/hr Check anti-Xa level in 6-8 hours and daily while on heparin Continue to monitor H&H and platelets  Elder Cyphers, BS Loura Back, BCPS Clinical Pharmacist Pager [phone] 10/04/2016,2:41 PM

## 2016-10-04 NOTE — H&P (Signed)
History and Physical  Cristina Munoz:[id number] DOB: [birth year] DOA: 10/04/2016   PCP: Patient, No Pcp Per   Patient coming from: Home  Chief Complaint: left side chest pain  HPI:  Cristina Munoz is a 66 y.o. female with medical history of essential hypertension presenting with left-sided chest pain in the left lower anterior and posterior rib area that began on 10/01/2016. She describes it as sharp in nature that was worse with any type of movement as well as breathing, coughing, sneezing. She states that it improved over a period of the next 1-2 days, but it returned on the evening of 10/03/2016. The left sided pain became more constant and greater in intensity prompting the patient come to the emergency department for further evaluation. She states that she has been more short of breath than usual. She denies any fevers, chills, coughing, hemoptysis, nausea, vomiting, diarrhea, hematochezia, melena. The patient denies any recent surgeries or long airplane trips or car rides. She has not had any recent injury, trauma or surgery.  She has not started any new medications. She states that she takes no prescription medications. She has not seen a physician in over 10 years. Workup in the emergency department including CTA of chest revealed left-sided segmental and subsegmental emboli in the left upper lobe and right upper lobe with a trace pleural effusion.    Interestingly, the patient states that she had a DVT approximately 10 years ago. She took warfarin for a "few" months, and she subsequently stopped taking warfarin on her own because she was feeling better and also related to loss of her insurance benefits. The patient does not recall the circumstances surrounding the diagnosis of a DVT. She states that 2 of her sons have had some clotting problems, but she denies any other family history of venous thromboembolism. The patient was afebrile and hemodynamically stable saturating 97% on room  air.  The patient was started on intravenous heparin and admitted for further evaluation. BMP, CBC, and hepatic enzymes are unremarkable.  Assessment/Plan: Acute PE -appears unprovoked by clinical history -Echo -continue IV heparin -patient will need lifelong anticoagulation -check factor V leiden -prothrombin gene mutation -lupus anticoagulant  T2, T3, T5 Luncencies -SPEP -serum immunofixation -Bone scan -skeletal survey if serum immunofixation suggest myeloma  Pleuritic chest pain -due to PE -fentanyl prn pain -cycle troponins  Essential hypertension -monitor off of meds for now -BP remains acceptable  Active Problems:   Acute pulmonary embolus [facility])       Past Medical History:  Diagnosis Date  . Arthritis   . Asthma   . DVT (deep venous thrombosis) (HCC)   . Hypertension    History reviewed. No pertinent surgical history. Social History:  reports that she has never smoked. She has never used smokeless tobacco. She reports that she does not drink alcohol or use drugs.   No family history on file.   Allergies  Allergen Reactions  . Fish Allergy Other (See Comments)    Mouth and lips swell  . Tomato Swelling     Prior to Admission medications   Medication Sig Start Date End Date Taking? Authorizing Provider  diphenhydrAMINE (BENADRYL) 25 mg capsule Take 25 mg by mouth every 6 (six) hours as needed for allergies.   Yes [provider]    Review of Systems:  Constitutional:  No weight loss, night sweats, Fevers, chills, fatigue.  Head&Eyes: No headache.  No vision loss.  No eye pain or scotoma  ENT:  No Difficulty swallowing,Tooth/dental problems,Sore throat,  No ear ache, post nasal drip,  Cardio-vascular:  No chest pain, Orthopnea, PND, swelling in lower extremities,  dizziness, palpitations  GI:  No  abdominal pain, nausea, vomiting, diarrhea, loss of appetite, hematochezia, melena, heartburn, indigestion, Resp:  No shortness of  breath with exertion or at rest. No cough. No coughing up of blood .No wheezing.No chest wall deformity  Skin:  no rash or lesions.  GU:  no dysuria, change in color of urine, no urgency or frequency. No flank pain.  Musculoskeletal:  No joint pain or swelling. No decreased range of motion. No back pain.  Psych:  No change in mood or affect. No depression or anxiety. Neurologic: No headache, no dysesthesia, no focal weakness, no vision loss. No syncope  Physical Exam: Vitals:   10/04/16 1219 10/04/16 1230 10/04/16 1300 10/04/16 1437  BP: (!) 160/90 (!) 172/94 (!) 159/93 (!) 140/92  Pulse: 69 63 (!) 59 71  Resp:  18 13 (!) 22  Temp:      TempSrc:      SpO2: 99% 98% 95% 100%  Weight:      Height:       General:  A&O x 3, NAD, nontoxic, pleasant/cooperative Head/Eye: No conjunctival hemorrhage, no icterus, [place]/AT, No nystagmus ENT:  No icterus,  No thrush, good dentition, no pharyngeal exudate Neck:  No masses, no lymphadenpathy, no bruits CV:  RRR, no rub, no gallop, no S3 Lung:  CTAB, good air movement, no wheeze, no rhonchi Abdomen: soft/NT, +BS, nondistended, no peritoneal signs Ext: No cyanosis, No rashes, No petechiae, No lymphangitis, No edema Neuro: CNII-XII intact, strength 4/5 in bilateral upper and lower extremities, no dysmetria  Labs on Admission:  Basic Metabolic Panel:  Recent Labs Lab 10/04/16 1305  NA 142  K 4.0  CL 107  CO2 29  GLUCOSE 111*  BUN 11  CREATININE 0.72  CALCIUM 9.0   Liver Function Tests:  Recent Labs Lab 10/04/16 1305  AST 12*  ALT 7*  ALKPHOS 88  BILITOT 0.5  PROT 7.0  ALBUMIN 3.5    Recent Labs Lab 10/04/16 1305  LIPASE 24   No results for input(s): AMMONIA in the last 168 hours. CBC:  Recent Labs Lab 10/04/16 1305  WBC 6.9  NEUTROABS 4.5  HGB 11.4*  HCT 35.0*  MCV 87.3  PLT 208   Coagulation Profile:  Recent Labs Lab 10/04/16 1305  INR 1.05   Cardiac Enzymes: No results for input(s): CKTOTAL, CKMB,  CKMBINDEX, TROPONINI in the last 168 hours. BNP: Invalid input(s): POCBNP CBG: No results for input(s): GLUCAP in the last 168 hours. Urine analysis:    Component Value Date/Time   COLORURINE YELLOW 10/04/2016 1210   APPEARANCEUR CLEAR 10/04/2016 1210   LABSPEC 1.015 10/04/2016 1210   PHURINE 8.0 10/04/2016 1210   GLUCOSEU NEGATIVE 10/04/2016 1210   HGBUR NEGATIVE 10/04/2016 1210   BILIRUBINUR NEGATIVE 10/04/2016 1210   KETONESUR NEGATIVE 10/04/2016 1210   PROTEINUR NEGATIVE 10/04/2016 1210   UROBILINOGEN 0.2 12/29/2006 1435   NITRITE NEGATIVE 10/04/2016 1210   LEUKOCYTESUR NEGATIVE 10/04/2016 1210   Sepsis Labs: (procalcitonin:4,lacticidven:4) )No results found for this or any previous visit (from the past 240 hour(s)).   Radiological Exams on Admission: Ct Angio Chest Pe W And/or Wo Contrast  Result Date: 10/04/2016 CLINICAL DATA:  66 year old female with a history of left-sided abdominal rib pain. EXAM: CT ANGIOGRAPHY CHEST WITH CONTRAST TECHNIQUE: Multidetector CT imaging of the chest was performed using  the standard protocol during bolus administration of intravenous contrast. Multiplanar CT image reconstructions and MIPs were obtained to evaluate the vascular anatomy. CONTRAST:  100 cc Isovue 370 COMPARISON:  12/20/2011 FINDINGS: Cardiovascular: Heart: No cardiomegaly. No pericardial fluid/thickening. No significant coronary calcifications. Diameter of right ventricle to left ventricle measures less than 1. No inversion of the interventricular septum. No enlarged right ventricle. No reflux of contrast into the hepatic IVC. Aorta: Unremarkable course, caliber, contour of the thoracic aorta. No aneurysm or dissection flap. No periaortic fluid. Minimal calcifications of the aortic arch. Branch vessels remain patent. Pulmonary arteries: Filling defects of left-sided segmental, subsegmental vessels of the left upper lobe and right upper lobe. No main pulmonary artery filling  defect. No right lobar filling defects. No right-sided segmental or subsegmental emboli identified. Trace left pleural effusion. Mediastinum/Nodes: No mediastinal adenopathy. Unremarkable appearance of the thoracic inlet and thyroid. Lungs/Pleura: Mixed ground-glass and nodular opacity of the left lower lobe, an the distribution related to pulmonary emboli. Small pleural effusion. No pneumothorax. Upper Abdomen: Unremarkable. Musculoskeletal: No acute displaced fracture. There are small lucent lesions within the vertebral bodies of T2 and T3. Lucent lesion of the spinous process of T5. No fracture identified. Review of the MIP images confirms the above findings. IMPRESSION: Study is positive for left-sided pulmonary embolism involving segmental and subsegmental vessels. No evidence of right-sided heart strain. Mixed in linear and nodular changes of the left lower lobe, compatible with consolidation in the distribution of affected pulmonary vessels. Trace reactive left pleural effusion. Small lucent lesions within the vertebral body of T2 and T3, and the posterior elements of T5. These are nonspecific, though could represent lytic lesions in the setting of multiple on myeloma or other metastatic disease. Correlation with lab values and potentially bone scan may be useful if there is concern for malignancy as a risk factors for the patient's pulmonary embolism. Results called by telephone at the time of interpretation on 10/04/2016 at 2:30 pm to Dr. Alona Bene , who verbally acknowledged these results. Electronically Signed   By: Gilmer Mor D.O.   On: 10/04/2016 14:33   Dg Abdomen Acute W/chest  Result Date: 10/04/2016 CLINICAL DATA:  Chest pain and flank pain EXAM: DG ABDOMEN ACUTE W/ 1V CHEST COMPARISON:  Chest CT 12/20/2011 Chest radiograph 12/20/2011 FINDINGS: Lungs are well inflated without focal consolidation. Mild pulmonary vascular congestion without overt pulmonary edema. Cardiomediastinal contours are  normal. No pleural effusion or pneumothorax. No free intraperitoneal air. There is a calcified fibroid in the right hemipelvis that measures approximately 4.6 cm. There are no calcifications overlying the renal shadows or the expected courses of the ureters. IMPRESSION: 1. Mild pulmonary vascular congestion without overt edema. 2. No free intraperitoneal air. 3. No visible nephroureterolithiasis. Electronically Signed   By: Deatra Robinson M.D.   On: 10/04/2016 13:00    EKG: Independently reviewed. Sinus, nonspecific T wave inversion V1, III    Time spent:70 minutes Code Status:   FULL Family Communication:  Granddaughter at bedside Disposition Plan: expect 2-3 day hospitalization Consults called: none DVT Prophylaxis: IV heparin  Herminia Warren, DO  [facility] Pager [phone]  If 7PM-7AM, please contact night-coverage [email or web address] Password [facility] 10/04/2016, 3:41 PM

## 2016-10-05 ENCOUNTER — Inpatient Hospital Stay (HOSPITAL_COMMUNITY): Payer: Medicare Other

## 2016-10-05 DIAGNOSIS — I361 Nonrheumatic tricuspid (valve) insufficiency: Secondary | ICD-10-CM

## 2016-10-05 DIAGNOSIS — I2699 Other pulmonary embolism without acute cor pulmonale: Principal | ICD-10-CM

## 2016-10-05 DIAGNOSIS — R0781 Pleurodynia: Secondary | ICD-10-CM

## 2016-10-05 LAB — CBC WITH DIFFERENTIAL/PLATELET
BASOS ABS: 0 10*3/uL (ref 0.0–0.1)
BASOS PCT: 0 %
EOS ABS: 0 10*3/uL (ref 0.0–0.7)
Eosinophils Relative: 0 %
HEMATOCRIT: 34.1 % — AB (ref 36.0–46.0)
HEMOGLOBIN: 11.1 g/dL — AB (ref 12.0–15.0)
Lymphocytes Relative: 18 %
Lymphs Abs: 1.7 10*3/uL (ref 0.7–4.0)
MCH: 28.6 pg (ref 26.0–34.0)
MCHC: 32.6 g/dL (ref 30.0–36.0)
MCV: 87.9 fL (ref 78.0–100.0)
MONO ABS: 0.5 10*3/uL (ref 0.1–1.0)
Monocytes Relative: 5 %
NEUTROS ABS: 7.2 10*3/uL (ref 1.7–7.7)
Neutrophils Relative %: 77 %
Platelets: 211 10*3/uL (ref 150–400)
RBC: 3.88 MIL/uL (ref 3.87–5.11)
RDW: 14.8 % (ref 11.5–15.5)
WBC: 9.4 10*3/uL (ref 4.0–10.5)

## 2016-10-05 LAB — ECHOCARDIOGRAM COMPLETE
Height: 67 in
Weight: 3830.71 oz

## 2016-10-05 LAB — HEPARIN LEVEL (UNFRACTIONATED): HEPARIN UNFRACTIONATED: 0.32 [IU]/mL (ref 0.30–0.70)

## 2016-10-05 LAB — TROPONIN I: Troponin I: <0.03 ng/mL (ref <0.03)

## 2016-10-05 NOTE — Progress Notes (Signed)
ANTICOAGULATION CONSULT NOTE - follow up  Pharmacy Consult for Heparin Indication: pulmonary embolus  Allergies  Allergen Reactions  . Fish Allergy Other (See Comments)    Mouth and lips swell  . Tomato Swelling    Patient Measurements: Height:  (170.2 cm) Weight: 239 lb 6.7 oz (108.6 kg) IBW/kg (Calculated) : 61.6 HEPARIN DW (KG): 86.5  Vital Signs: Temp: 98.6 F (37 C) (09/16 0443) Temp Source: Oral (09/16 0443) BP: 134/77 (09/16 0443) Pulse Rate: 78 (09/16 0443)  Labs:  Recent Labs  10/04/16 1305 10/04/16 1718 10/04/16 2048 10/04/16 2049 10/05/16 0520 10/05/16 0521  HGB 11.4*  --   --   --  11.1*  --   HCT 35.0*  --   --   --  34.1*  --   PLT 208  --   --   --  211  --   LABPROT 13.6  --   --   --   --   --   INR 1.05  --   --   --   --   --   HEPARINUNFRC  --   --  0.88*  --   --  0.32  CREATININE 0.72  --   --   --   --   --   TROPONINI  --  <0.03  --  <0.03 <0.03  --     Estimated Creatinine Clearance: 86.6 mL/min (by C-G formula based on SCr of 0.72 mg/dL).   Medical History: Past Medical History:  Diagnosis Date  . Arthritis   . Asthma   . DVT (deep venous thrombosis) (HCC)   . Hypertension     Medications:  See med rec  Assessment: 66 yo female brought into ED with left sided abdomina/rib pain that was worse with movement and breathing. CT scan shows left -sided PE. Plan to start heparin. Heparin level is therapeutic this AM.  Goal of Therapy:  Heparin level 0.3-0.7 units/ml Monitor platelets by anticoagulation protocol: Yes   Plan:  Continue heparin infusion at 1200 units/hr Check anti-Xa level daily while on heparin Continue to monitor H&H and platelets F/U plan for oral anticaogulation  Elder Cyphers, BS Loura Back, BCPS Clinical Pharmacist Pager [phone] 10/05/2016,9:15 AM

## 2016-10-05 NOTE — Progress Notes (Addendum)
PROGRESS NOTE  Cristina Munoz GMW:[id number] DOB: 1950/12/10 DOA: 10/04/2016 PCP: Patient, No Pcp Per  Brief History:  66 y.o. female with medical history of essential hypertension presenting with left-sided chest pain in the left lower anterior and posterior rib area that began on 10/01/2016. She describes it as sharp in nature that was worse with any type of movement as well as breathing, coughing, sneezing. She states that it improved over a period of the next 1-2 days, but it returned on the evening of 10/03/2016. The left sided pain became more constant and greater in intensity prompting the patient come to the emergency department for further evaluation. She states that she has been more short of breath than usual.  She has not seen a physician in over 10 years. Workup in the emergency department including CTA of chest revealed left-sided segmental and subsegmental emboli in the left upper lobe and right upper lobe with a trace pleural effusion.  Interestingly, the patient states that she had a DVT approximately 10 years ago. She took warfarin for a "few" months, and she subsequently stopped taking warfarin on her own because she was feeling better and also related to loss of her insurance benefits. The patient does not recall the circumstances surrounding the diagnosis of a DVT.  Assessment/Plan: Acute PE -appears unprovoked by clinical history -Echo-EF 65%, no WMA, normal RV function, mild MR -continue IV heparin -patient will need lifelong anticoagulation -check factor V leiden -check prothrombin gene mutation -check lupus anticoagulant  T2, T3, T5 Lucencies -SPEP -serum immunofixation -Bone scan -skeletal survey if serum immunofixation suggest myeloma  Pleuritic chest pain -due to PE -fentanyl prn pain -cycle troponins--neg x 3  Essential hypertension -monitor off of meds for now -BP remains acceptable -pain likely contributing to elevated BP   Disposition  Plan:   Home 9/17 or 9/18 Family Communication:   Daughter and grand daughter updated at bedside 9/16  Consultants:  none  Code Status:  FULL  DVT Prophylaxis:  IV Heparin   Procedures: As Listed in Progress Note Above  Antibiotics: None    Subjective: Pt c/o left sided pleuritic chest pain.  Also CP worsens with certain movements.  Denies sob, n/v/d, abd pain, hematochezia, melena, hematuria.    Objective: Vitals:   10/04/16 1638 10/04/16 2118 10/05/16 0443 10/05/16 1500  BP: 121/88 135/69 134/77 (!) 145/99  Pulse: 76 76 78 72  Resp: Temp: 98 F (36.7 C) 98.7 F (37.1 C) 98.6 F (37 C) 99.3 F (37.4 C)  TempSrc: Oral Oral Oral Oral  SpO2: 98% 99% 100% 99%  Weight: 108.6 kg (239 lb 6.7 oz)     Height:  (1.702 m)       Intake/Output Summary (Last 24 hours) at 10/05/16 1630 Last data filed at 10/05/16 1500  Gross per 24 hour  Intake           870.83 ml  Output                0 ml  Net           870.83 ml   Weight change:  Exam:   General:  Pt is alert, follows commands appropriately, not in acute distress  HEENT: No icterus, No thrush, No neck mass, [place]/AT  Cardiovascular: RRR, S1/S2, no rubs, no gallops  Respiratory: bibasilar crackles, no wheeze  Abdomen: Soft/+BS, non tender, non distended, no guarding  Extremities:  No edema, No lymphangitis, No petechiae, No rashes, no synovitis   Data Reviewed: I have personally reviewed following labs and imaging studies Basic Metabolic Panel:  Recent Labs Lab 10/04/16 1305  NA 142  K 4.0  CL 107  CO2 29  GLUCOSE 111*  BUN 11  CREATININE 0.72  CALCIUM 9.0   Liver Function Tests:  Recent Labs Lab 10/04/16 1305  AST 12*  ALT 7*  ALKPHOS 88  BILITOT 0.5  PROT 7.0  ALBUMIN 3.5    Recent Labs Lab 10/04/16 1305  LIPASE 24   No results for input(s): AMMONIA in the last 168 hours. Coagulation Profile:  Recent Labs Lab 10/04/16 1305  INR 1.05   CBC:  Recent Labs Lab  10/04/16 1305 10/05/16 0520  WBC 6.9 9.4  NEUTROABS 4.5 7.2  HGB 11.4* 11.1*  HCT 35.0* 34.1*  MCV 87.3 87.9  PLT 208 211   Cardiac Enzymes:  Recent Labs Lab 10/04/16 1718 10/04/16 2049 10/05/16 0520  TROPONINI <0.03 <0.03 <0.03   BNP: Invalid input(s): POCBNP CBG: No results for input(s): GLUCAP in the last 168 hours. HbA1C: No results for input(s): HGBA1C in the last 72 hours. Urine analysis:    Component Value Date/Time   COLORURINE YELLOW 10/04/2016 1210   APPEARANCEUR CLEAR 10/04/2016 1210   LABSPEC 1.015 10/04/2016 1210   PHURINE 8.0 10/04/2016 1210   GLUCOSEU NEGATIVE 10/04/2016 1210   HGBUR NEGATIVE 10/04/2016 1210   BILIRUBINUR NEGATIVE 10/04/2016 1210   KETONESUR NEGATIVE 10/04/2016 1210   PROTEINUR NEGATIVE 10/04/2016 1210   UROBILINOGEN 0.2 12/29/2006 1435   NITRITE NEGATIVE 10/04/2016 1210   LEUKOCYTESUR NEGATIVE 10/04/2016 1210   Sepsis Labs: (procalcitonin:4,lacticidven:4) )No results found for this or any previous visit (from the past 240 hour(s)).   Scheduled Meds: Continuous Infusions: . heparin 1,200 Units/hr (10/05/16 1024)    Procedures/Studies: Ct Angio Chest Pe W And/or Wo Contrast  Result Date: 10/04/2016 CLINICAL DATA:  66 year old female with a history of left-sided abdominal rib pain. EXAM: CT ANGIOGRAPHY CHEST WITH CONTRAST TECHNIQUE: Multidetector CT imaging of the chest was performed using the standard protocol during bolus administration of intravenous contrast. Multiplanar CT image reconstructions and MIPs were obtained to evaluate the vascular anatomy. CONTRAST:  100 cc Isovue 370 COMPARISON:  12/20/2011 FINDINGS: Cardiovascular: Heart: No cardiomegaly. No pericardial fluid/thickening. No significant coronary calcifications. Diameter of right ventricle to left ventricle measures less than 1. No inversion of the interventricular septum. No enlarged right ventricle. No reflux of contrast into the hepatic IVC. Aorta:  Unremarkable course, caliber, contour of the thoracic aorta. No aneurysm or dissection flap. No periaortic fluid. Minimal calcifications of the aortic arch. Branch vessels remain patent. Pulmonary arteries: Filling defects of left-sided segmental, subsegmental vessels of the left upper lobe and right upper lobe. No main pulmonary artery filling defect. No right lobar filling defects. No right-sided segmental or subsegmental emboli identified. Trace left pleural effusion. Mediastinum/Nodes: No mediastinal adenopathy. Unremarkable appearance of the thoracic inlet and thyroid. Lungs/Pleura: Mixed ground-glass and nodular opacity of the left lower lobe, an the distribution related to pulmonary emboli. Small pleural effusion. No pneumothorax. Upper Abdomen: Unremarkable. Musculoskeletal: No acute displaced fracture. There are small lucent lesions within the vertebral bodies of T2 and T3. Lucent lesion of the spinous process of T5. No fracture identified. Review of the MIP images confirms the above findings. IMPRESSION: Study is positive for left-sided pulmonary embolism involving segmental and subsegmental vessels. No evidence of right-sided heart strain. Mixed in linear and nodular changes of  the left lower lobe, compatible with consolidation in the distribution of affected pulmonary vessels. Trace reactive left pleural effusion. Small lucent lesions within the vertebral body of T2 and T3, and the posterior elements of T5. These are nonspecific, though could represent lytic lesions in the setting of multiple on myeloma or other metastatic disease. Correlation with lab values and potentially bone scan may be useful if there is concern for malignancy as a risk factors for the patient's pulmonary embolism. Results called by telephone at the time of interpretation on 10/04/2016 at 2:30 pm to Dr. Alona Bene , who verbally acknowledged these results. Electronically Signed   By: Gilmer Mor D.O.   On: 10/04/2016 14:33   Dg  Abdomen Acute W/chest  Result Date: 10/04/2016 CLINICAL DATA:  Chest pain and flank pain EXAM: DG ABDOMEN ACUTE W/ 1V CHEST COMPARISON:  Chest CT 12/20/2011 Chest radiograph 12/20/2011 FINDINGS: Lungs are well inflated without focal consolidation. Mild pulmonary vascular congestion without overt pulmonary edema. Cardiomediastinal contours are normal. No pleural effusion or pneumothorax. No free intraperitoneal air. There is a calcified fibroid in the right hemipelvis that measures approximately 4.6 cm. There are no calcifications overlying the renal shadows or the expected courses of the ureters. IMPRESSION: 1. Mild pulmonary vascular congestion without overt edema. 2. No free intraperitoneal air. 3. No visible nephroureterolithiasis. Electronically Signed   By: Deatra Robinson M.D.   On: 10/04/2016 13:00    Jasminne Mealy, DO  [facility] Pager [phone]  If 7PM-7AM, please contact night-coverage [email or web address] Password [email or web address] 10/05/2016, 4:30 PM   LOS: 1 day

## 2016-10-05 NOTE — Progress Notes (Signed)
*  PRELIMINARY RESULTS* Echocardiogram 2D Echocardiogram has been performed.  Cristina Munoz 10/05/2016, 11:35 AM

## 2016-10-06 ENCOUNTER — Inpatient Hospital Stay (HOSPITAL_COMMUNITY): Payer: Medicare Other

## 2016-10-06 DIAGNOSIS — R55 Syncope and collapse: Secondary | ICD-10-CM

## 2016-10-06 DIAGNOSIS — I9589 Other hypotension: Secondary | ICD-10-CM

## 2016-10-06 LAB — LACTIC ACID, PLASMA
LACTIC ACID, VENOUS: 2.5 mmol/L — AB (ref 0.5–1.9)
Lactic Acid, Venous: 0.8 mmol/L (ref 0.5–1.9)

## 2016-10-06 LAB — CBC
HEMATOCRIT: 34.1 % — AB (ref 36.0–46.0)
Hemoglobin: 11.3 g/dL — ABNORMAL LOW (ref 12.0–15.0)
MCH: 28.8 pg (ref 26.0–34.0)
MCHC: 33.1 g/dL (ref 30.0–36.0)
MCV: 86.8 fL (ref 78.0–100.0)
Platelets: 197 10*3/uL (ref 150–400)
RBC: 3.93 MIL/uL (ref 3.87–5.11)
RDW: 14.3 % (ref 11.5–15.5)
WBC: 9.4 10*3/uL (ref 4.0–10.5)

## 2016-10-06 LAB — COMPREHENSIVE METABOLIC PANEL
ALBUMIN: 3.2 g/dL — AB (ref 3.5–5.0)
ALT: 9 U/L — ABNORMAL LOW (ref 14–54)
AST: 15 U/L (ref 15–41)
Alkaline Phosphatase: 81 U/L (ref 38–126)
Anion gap: 9 (ref 5–15)
BUN: 10 mg/dL (ref 6–20)
CHLORIDE: 103 mmol/L (ref 101–111)
CO2: 24 mmol/L (ref 22–32)
Calcium: 8.5 mg/dL — ABNORMAL LOW (ref 8.9–10.3)
Creatinine, Ser: 0.85 mg/dL (ref 0.44–1.00)
GFR calc Af Amer: >60 mL/min (ref >60)
GLUCOSE: 150 mg/dL — AB (ref 65–99)
POTASSIUM: 4.2 mmol/L (ref 3.5–5.1)
Sodium: 136 mmol/L (ref 135–145)
Total Bilirubin: 0.7 mg/dL (ref 0.3–1.2)
Total Protein: 6.8 g/dL (ref 6.5–8.1)

## 2016-10-06 LAB — GLUCOSE, CAPILLARY: GLUCOSE-CAPILLARY: 114 mg/dL — AB (ref 65–99)

## 2016-10-06 LAB — TROPONIN I: Troponin I: <0.03 ng/mL (ref <0.03)

## 2016-10-06 LAB — HEPARIN LEVEL (UNFRACTIONATED)

## 2016-10-06 MED ORDER — SODIUM CHLORIDE 0.9 % IV BOLUS (SEPSIS)
500.0000 mL | Freq: Once | INTRAVENOUS | Status: AC
  Administered 2016-10-06: 500 mL via INTRAVENOUS

## 2016-10-06 MED ORDER — APIXABAN 5 MG PO TABS
10.0000 mg | ORAL_TABLET | Freq: Two times a day (BID) | ORAL | Status: DC
  Administered 2016-10-06 – 2016-10-07 (×2): 10 mg via ORAL
  Filled 2016-10-06 (×3): qty 2

## 2016-10-06 MED ORDER — OXYCODONE-ACETAMINOPHEN 5-325 MG PO TABS: 1.0000 | ORAL_TABLET | Freq: Four times a day (QID) | ORAL | 0 refills | Status: DC | PRN

## 2016-10-06 MED ORDER — APIXABAN 5 MG PO TABS
10.0000 mg | ORAL_TABLET | Freq: Two times a day (BID) | ORAL | 0 refills | Status: DC
Start: 2016-10-06 — End: 2016-10-22

## 2016-10-06 MED ORDER — APIXABAN 5 MG PO TABS: 5.0000 mg | ORAL_TABLET | Freq: Two times a day (BID) | ORAL | Status: DC

## 2016-10-06 MED ORDER — TECHNETIUM TC 99M MEDRONATE IV KIT
25.0000 | PACK | Freq: Once | INTRAVENOUS | Status: AC | PRN
  Administered 2016-10-06: 25 via INTRAVENOUS

## 2016-10-06 NOTE — Discharge Instructions (Signed)
Information on my medicine - ELIQUIS (apixaban)  This medication education was reviewed with me or my healthcare representative as part of my discharge preparation.  The pharmacist that spoke with me during my hospital stay was:  Wayland Denis, [facility]  Why was Eliquis prescribed for you? Eliquis was prescribed to treat blood clots that may have been found in the veins of your legs (deep vein thrombosis) or in your lungs (pulmonary embolism) and to reduce the risk of them occurring again.  What do You need to know about Eliquis ? The starting dose is 10 mg (two 5 mg tablets) taken TWICE daily for the FIRST SEVEN (7) DAYS, then on (enter date)  10/13/16  the dose is reduced to ONE 5 mg tablet taken TWICE daily.  Eliquis may be taken with or without food.   Try to take the dose about the same time in the morning and in the evening. If you have difficulty swallowing the tablet whole please discuss with your pharmacist how to take the medication safely.  Take Eliquis exactly as prescribed and DO NOT stop taking Eliquis without talking to the doctor who prescribed the medication.  Stopping may increase your risk of developing a new blood clot.  Refill your prescription before you run out.  After discharge, you should have regular check-up appointments with your healthcare provider that is prescribing your Eliquis.    What do you do if you miss a dose? If a dose of ELIQUIS is not taken at the scheduled time, take it as soon as possible on the same day and twice-daily administration should be resumed. The dose should not be doubled to make up for a missed dose.  Important Safety Information A possible side effect of Eliquis is bleeding. You should call your healthcare provider right away if you experience any of the following: ? Bleeding from an injury or your nose that does not stop. ? Unusual colored urine (red or dark brown) or unusual colored stools (red or black). ? Unusual bruising for  unknown reasons. ? A serious fall or if you hit your head (even if there is no bleeding).  Some medicines may interact with Eliquis and might increase your risk of bleeding or clotting while on Eliquis. To help avoid this, consult your healthcare provider or pharmacist prior to using any new prescription or non-prescription medications, including herbals, vitamins, non-steroidal anti-inflammatory drugs (NSAIDs) and supplements.  This website has more information on Eliquis (apixaban): http://[email or web address].com/eliquis/home

## 2016-10-06 NOTE — Progress Notes (Signed)
Patient was in the bathroom to have a BM, ambulated to the bathroom without any assistance, started feeling bad, pulled the cord in the bathroom. NT came to check on patient and informed me that the patient was going "in and out" and needed me to come check on her.  As soon as I arrived, the patient was still sitting on the toilet, clammy, arouseable, answers questions when asked, VS taken, BP 66/52 P69, NS started wide open, Dr. Arbutus Leas in the room, orders entered.

## 2016-10-06 NOTE — Discharge Summary (Signed)
Physician Discharge Summary  Cristina Munoz ZOX:[id number] DOB: 02-Apr-1950 DOA: 10/04/2016  PCP: Patient, No Pcp Per  Admit date: 10/04/2016 Discharge date: 10/07/2016  Admitted From: Home Disposition:  Home   Recommendations for Outpatient Follow-up:  1. Follow up with PCP in 1-2 weeks 2. Please obtain BMP/CBC in one week    Discharge Condition: Stable CODE STATUS:FULL Diet recommendation: Heart Healthy    Brief/Interim Summary: 66 y.o.femalewith medical history ofessential hypertension presenting with left-sided chest pain in the left lower anterior and posterior rib area that began on 10/01/2016. She describes it as sharp in nature that was worse with any type of movement as well as breathing, coughing, sneezing. She states that it improved over a period of the next 1-2 days, but it returned on the evening of 10/03/2016. The left sided pain became more constant and greater in intensity prompting the patient come to the emergency department for further evaluation. She states that she has been more short of breath than usual.  She has not seen a physician in over 10 years. Workup in the emergency department including CTA of chest revealed left-sided segmental and subsegmental emboli in the left upper lobe and right upper lobe with a trace pleural effusion.  Interestingly, the patient states that she had a DVT approximately 10 years ago. She took warfarin for a "few"months, and she subsequently stopped taking warfarin on her own because she was feeling better and also related to loss of her insurance benefits. The patient does not recall the circumstances surrounding the diagnosis of a DVT.  Discharge Diagnoses:  Acute PE -appears unprovoked by clinical history -Echo-EF 65%, no WMA, normal RV function, mild MR -continue IV heparin-->po apixaban -patient will need lifelong anticoagulation -check factor V leiden--results pending at time of discharge -check prothrombin gene  mutation--pending -check lupus anticoagulant--pending  T2, T3, T5 Lucencies -SPEP--pending at time of discharge -serum immunofixation--results pending -Bone scan--neg for neoplastic disease -skeletal survey if serum immunofixation suggest myeloma  VasoVagal reaction -Patient had diaphoresis and hypotension sitting on the commode and afternoon 10/06/2016 -Blood pressure dropped to 66/52 -Telemetry reviewed--no dysrhythmias -Improved with 500 mL normal saline -Patient now asymptomatic -personally reviewed EKG--sinus, nonspecific T-wave changes -personally reviewed CXR--poor inspiration, left effusion -CMP, CBC unremarkable  Pleuritic chest pain -due to PE -fentanyl prn pain -cycle troponins--neg x 3  -home with percocet, #10, q 6 hrs prn pain, no RF -The [place] Controlled Substance Reporting System has been queried for this patient for the past 12 months prior to prescribing any opioids.  Essential hypertension -monitor off of meds for now -BP remains acceptable -pain likely contributing to elevated BP    Discharge Instructions   Allergies as of 10/07/2016      Reactions   Fish Allergy Other (See Comments)   Mouth and lips swell   Tomato Swelling      Medication List    TAKE these medications   apixaban 5 MG Tabs tablet Commonly known as:  ELIQUIS Take 2 tablets (10 mg total) by mouth 2 (two) times daily. Starting 10/13/16, take 1 tablet (5 mg) two times daily   diphenhydrAMINE 25 mg capsule Commonly known as:  BENADRYL Take 25 mg by mouth every 6 (six) hours as needed for allergies.   oxyCODONE-acetaminophen 5-325 MG tablet Commonly known as:  PERCOCET/ROXICET Take 1 tablet by mouth every 6 (six) hours as needed for moderate pain.            Discharge Care Instructions  Start     Ordered   10/06/16 0000  oxyCODONE-acetaminophen (PERCOCET/ROXICET) 5-325 MG tablet  Every 6 hours PRN     10/06/16 1732   10/06/16 0000  apixaban  (ELIQUIS) 5 MG TABS tablet  2 times daily     10/06/16 1734     Follow-up Information    [facility] Follow up on 10/22/2016.   Why:  1pm - arrive 15 minsutes early; bring list of medications and insurance cards Contact information: [address] [place] [address] [phone]         Allergies  Allergen Reactions  . Fish Allergy Other (See Comments)    Mouth and lips swell  . Tomato Swelling    Consultations:  none   Procedures/Studies: Ct Angio Chest Pe W And/or Wo Contrast  Result Date: 10/04/2016 CLINICAL DATA:  66 year old female with a history of left-sided abdominal rib pain. EXAM: CT ANGIOGRAPHY CHEST WITH CONTRAST TECHNIQUE: Multidetector CT imaging of the chest was performed using the standard protocol during bolus administration of intravenous contrast. Multiplanar CT image reconstructions and MIPs were obtained to evaluate the vascular anatomy. CONTRAST:  100 cc Isovue 370 COMPARISON:  12/20/2011 FINDINGS: Cardiovascular: Heart: No cardiomegaly. No pericardial fluid/thickening. No significant coronary calcifications. Diameter of right ventricle to left ventricle measures less than 1. No inversion of the interventricular septum. No enlarged right ventricle. No reflux of contrast into the hepatic IVC. Aorta: Unremarkable course, caliber, contour of the thoracic aorta. No aneurysm or dissection flap. No periaortic fluid. Minimal calcifications of the aortic arch. Branch vessels remain patent. Pulmonary arteries: Filling defects of left-sided segmental, subsegmental vessels of the left upper lobe and right upper lobe. No main pulmonary artery filling defect. No right lobar filling defects. No right-sided segmental or subsegmental emboli identified. Trace left pleural effusion. Mediastinum/Nodes: No mediastinal adenopathy. Unremarkable appearance of the thoracic inlet and thyroid. Lungs/Pleura: Mixed ground-glass and nodular opacity of  the left lower lobe, an the distribution related to pulmonary emboli. Small pleural effusion. No pneumothorax. Upper Abdomen: Unremarkable. Musculoskeletal: No acute displaced fracture. There are small lucent lesions within the vertebral bodies of T2 and T3. Lucent lesion of the spinous process of T5. No fracture identified. Review of the MIP images confirms the above findings. IMPRESSION: Study is positive for left-sided pulmonary embolism involving segmental and subsegmental vessels. No evidence of right-sided heart strain. Mixed in linear and nodular changes of the left lower lobe, compatible with consolidation in the distribution of affected pulmonary vessels. Trace reactive left pleural effusion. Small lucent lesions within the vertebral body of T2 and T3, and the posterior elements of T5. These are nonspecific, though could represent lytic lesions in the setting of multiple on myeloma or other metastatic disease. Correlation with lab values and potentially bone scan may be useful if there is concern for malignancy as a risk factors for the patient's pulmonary embolism. Results called by telephone at the time of interpretation on 10/04/2016 at 2:30 pm to Dr. Alona Bene , who verbally acknowledged these results. Electronically Signed   By: Gilmer Mor D.O.   On: 10/04/2016 14:33   Nm Bone Scan Whole Body  Result Date: 10/06/2016 CLINICAL DATA:  Left Rib and flank pain since last Saturday. Pain happens upon coughing and sneezing. Abnormal xray of L/S spine, bone destruction. No previous history of cancer. Or falls EXAM: NUCLEAR MEDICINE WHOLE BODY BONE SCAN TECHNIQUE: Whole body anterior and posterior images were obtained approximately 3 hours after intravenous injection  of radiopharmaceutical. RADIOPHARMACEUTICALS:  20.1 mCi Technetium-60m MDP IV COMPARISON:  Chest CT, 10/04/2016. FINDINGS: There are no focal areas of radiotracer accumulation to suggest a fracture or neoplastic disease to bone. There is  uptake involving the spine, both shoulders, the sternoclavicular joints, sternomanubrial joint, both hips, both knees and the feet and ankles, all of which appears degenerative in origin. Renal uptake is symmetric. IMPRESSION: 1. No evidence of a fracture or of neoplastic disease to bone. 2. Multiple areas of degenerative uptake as detailed. Electronically Signed   By: Amie Portland M.D.   On: 10/06/2016 13:39   Dg Chest Port 1 View  Result Date: 10/06/2016 CLINICAL DATA:  66 year old female with recently diagnosed pulmonary embolism. Hypotension. EXAM: PORTABLE CHEST 1 VIEW COMPARISON:  Chest CTA 10/04/2016 and earlier. FINDINGS: Portable AP semi upright view at 1251 hours. Lower lung volumes with new confluent left lung base opacity. Probable small left pleural effusion. Mediastinal contours appear stable. Mild increased crowding of markings elsewhere but no other confluent opacity. No pneumothorax. Visualized tracheal air column is within normal limits. Paucity of bowel gas in the upper abdomen. IMPRESSION: 1. Worsening ventilation at the left lung base since 10/04/2016 could reflect progressive pulmonary infarct and/or pleural effusion in this clinical setting. 2. Lower lung volumes. Electronically Signed   By: Odessa Fleming M.D.   On: 10/06/2016 13:08   Dg Abdomen Acute W/chest  Result Date: 10/04/2016 CLINICAL DATA:  Chest pain and flank pain EXAM: DG ABDOMEN ACUTE W/ 1V CHEST COMPARISON:  Chest CT 12/20/2011 Chest radiograph 12/20/2011 FINDINGS: Lungs are well inflated without focal consolidation. Mild pulmonary vascular congestion without overt pulmonary edema. Cardiomediastinal contours are normal. No pleural effusion or pneumothorax. No free intraperitoneal air. There is a calcified fibroid in the right hemipelvis that measures approximately 4.6 cm. There are no calcifications overlying the renal shadows or the expected courses of the ureters. IMPRESSION: 1. Mild pulmonary vascular congestion without  overt edema. 2. No free intraperitoneal air. 3. No visible nephroureterolithiasis. Electronically Signed   By: Deatra Robinson M.D.   On: 10/04/2016 13:00        Discharge Exam: Vitals:   10/06/16 2218 10/07/16 0554  BP: 132/75 138/81  Pulse: 82 79  Resp: 18 18  Temp: 98.4 F (36.9 C) 98.2 F (36.8 C)  SpO2: 95% 97%   Vitals:   10/06/16 1300 10/06/16 1420 10/06/16 2218 10/07/16 0554  BP: 136/69 (!) 143/71 132/75 138/81  Pulse: 71 83 82 79  Resp:   18 18  Temp:  98.1 F (36.7 C) 98.4 F (36.9 C) 98.2 F (36.8 C)  TempSrc:  Oral Oral Oral  SpO2:  95% 95% 97%  Weight:      Height:        General: Pt is alert, awake, not in acute distress Cardiovascular: RRR, S1/S2 +, no rubs, no gallops Respiratory: CTA bilaterally, no wheezing, no rhonchi Abdominal: Soft, NT, ND, bowel sounds + Extremities: no edema, no cyanosis   The results of significant diagnostics from this hospitalization (including imaging, microbiology, ancillary and laboratory) are listed below for reference.    Significant Diagnostic Studies: Ct Angio Chest Pe W And/or Wo Contrast  Result Date: 10/04/2016 CLINICAL DATA:  66 year old female with a history of left-sided abdominal rib pain. EXAM: CT ANGIOGRAPHY CHEST WITH CONTRAST TECHNIQUE: Multidetector CT imaging of the chest was performed using the standard protocol during bolus administration of intravenous contrast. Multiplanar CT image reconstructions and MIPs were obtained to evaluate the vascular anatomy. CONTRAST:  100 cc Isovue 370 COMPARISON:  12/20/2011 FINDINGS: Cardiovascular: Heart: No cardiomegaly. No pericardial fluid/thickening. No significant coronary calcifications. Diameter of right ventricle to left ventricle measures less than 1. No inversion of the interventricular septum. No enlarged right ventricle. No reflux of contrast into the hepatic IVC. Aorta: Unremarkable course, caliber, contour of the thoracic aorta. No aneurysm or dissection flap.  No periaortic fluid. Minimal calcifications of the aortic arch. Branch vessels remain patent. Pulmonary arteries: Filling defects of left-sided segmental, subsegmental vessels of the left upper lobe and right upper lobe. No main pulmonary artery filling defect. No right lobar filling defects. No right-sided segmental or subsegmental emboli identified. Trace left pleural effusion. Mediastinum/Nodes: No mediastinal adenopathy. Unremarkable appearance of the thoracic inlet and thyroid. Lungs/Pleura: Mixed ground-glass and nodular opacity of the left lower lobe, an the distribution related to pulmonary emboli. Small pleural effusion. No pneumothorax. Upper Abdomen: Unremarkable. Musculoskeletal: No acute displaced fracture. There are small lucent lesions within the vertebral bodies of T2 and T3. Lucent lesion of the spinous process of T5. No fracture identified. Review of the MIP images confirms the above findings. IMPRESSION: Study is positive for left-sided pulmonary embolism involving segmental and subsegmental vessels. No evidence of right-sided heart strain. Mixed in linear and nodular changes of the left lower lobe, compatible with consolidation in the distribution of affected pulmonary vessels. Trace reactive left pleural effusion. Small lucent lesions within the vertebral body of T2 and T3, and the posterior elements of T5. These are nonspecific, though could represent lytic lesions in the setting of multiple on myeloma or other metastatic disease. Correlation with lab values and potentially bone scan may be useful if there is concern for malignancy as a risk factors for the patient's pulmonary embolism. Results called by telephone at the time of interpretation on 10/04/2016 at 2:30 pm to Dr. Alona Bene , who verbally acknowledged these results. Electronically Signed   By: Gilmer Mor D.O.   On: 10/04/2016 14:33   Nm Bone Scan Whole Body  Result Date: 10/06/2016 CLINICAL DATA:  Left Rib and flank pain  since last Saturday. Pain happens upon coughing and sneezing. Abnormal xray of L/S spine, bone destruction. No previous history of cancer. Or falls EXAM: NUCLEAR MEDICINE WHOLE BODY BONE SCAN TECHNIQUE: Whole body anterior and posterior images were obtained approximately 3 hours after intravenous injection of radiopharmaceutical. RADIOPHARMACEUTICALS:  20.1 mCi Technetium-40m MDP IV COMPARISON:  Chest CT, 10/04/2016. FINDINGS: There are no focal areas of radiotracer accumulation to suggest a fracture or neoplastic disease to bone. There is uptake involving the spine, both shoulders, the sternoclavicular joints, sternomanubrial joint, both hips, both knees and the feet and ankles, all of which appears degenerative in origin. Renal uptake is symmetric. IMPRESSION: 1. No evidence of a fracture or of neoplastic disease to bone. 2. Multiple areas of degenerative uptake as detailed. Electronically Signed   By: Amie Portland M.D.   On: 10/06/2016 13:39   Dg Chest Port 1 View  Result Date: 10/06/2016 CLINICAL DATA:  66 year old female with recently diagnosed pulmonary embolism. Hypotension. EXAM: PORTABLE CHEST 1 VIEW COMPARISON:  Chest CTA 10/04/2016 and earlier. FINDINGS: Portable AP semi upright view at 1251 hours. Lower lung volumes with new confluent left lung base opacity. Probable small left pleural effusion. Mediastinal contours appear stable. Mild increased crowding of markings elsewhere but no other confluent opacity. No pneumothorax. Visualized tracheal air column is within normal limits. Paucity of bowel gas in the upper abdomen. IMPRESSION: 1. Worsening ventilation at the left  lung base since 10/04/2016 could reflect progressive pulmonary infarct and/or pleural effusion in this clinical setting. 2. Lower lung volumes. Electronically Signed   By: Odessa Fleming M.D.   On: 10/06/2016 13:08   Dg Abdomen Acute W/chest  Result Date: 10/04/2016 CLINICAL DATA:  Chest pain and flank pain EXAM: DG ABDOMEN ACUTE W/ 1V  CHEST COMPARISON:  Chest CT 12/20/2011 Chest radiograph 12/20/2011 FINDINGS: Lungs are well inflated without focal consolidation. Mild pulmonary vascular congestion without overt pulmonary edema. Cardiomediastinal contours are normal. No pleural effusion or pneumothorax. No free intraperitoneal air. There is a calcified fibroid in the right hemipelvis that measures approximately 4.6 cm. There are no calcifications overlying the renal shadows or the expected courses of the ureters. IMPRESSION: 1. Mild pulmonary vascular congestion without overt edema. 2. No free intraperitoneal air. 3. No visible nephroureterolithiasis. Electronically Signed   By: Deatra Robinson M.D.   On: 10/04/2016 13:00     Microbiology: No results found for this or any previous visit (from the past 240 hour(s)).   Labs: Basic Metabolic Panel:  Recent Labs Lab 10/04/16 1305 10/06/16 1342 10/07/16 0551  NA 142 136 137  K 4.0 4.2 3.8  CL 107 103 104  CO2 GLUCOSE 111* 150* 105*  BUN CREATININE 0.72 0.85 0.66  CALCIUM 9.0 8.5* 8.6*   Liver Function Tests:  Recent Labs Lab 10/04/16 1305 10/06/16 1342  AST 12* 15  ALT 7* 9*  ALKPHOS 88 81  BILITOT 0.5 0.7  PROT 7.0 6.8  ALBUMIN 3.5 3.2*    Recent Labs Lab 10/04/16 1305  LIPASE 24   No results for input(s): AMMONIA in the last 168 hours. CBC:  Recent Labs Lab 10/04/16 1305 10/05/16 0520 10/06/16 1342 10/07/16 0551  WBC 6.9 9.4 9.4 7.3  NEUTROABS 4.5 7.2  --   --   HGB 11.4* 11.1* 11.3* 10.8*  HCT 35.0* 34.1* 34.1* 33.7*  MCV 87.3 87.9 86.8 86.9  PLT 208 211 197 224   Cardiac Enzymes:  Recent Labs Lab 10/04/16 2049 10/05/16 0520 10/06/16 1342 10/06/16 1800 10/07/16 0023  TROPONINI <0.03 <0.03 <0.03 <0.03 <0.03   BNP: Invalid input(s): POCBNP CBG:  Recent Labs Lab 10/06/16 1238  GLUCAP 114*    Time coordinating discharge:  Greater than 30 minutes  Signed:  Lauralye Kinn, DO [facility] Pager:  [phone] 10/07/2016, 11:30 AM

## 2016-10-06 NOTE — Progress Notes (Signed)
BP 96/60, patient still on the toilet and states "I feel better." Cristina Munoz is being used to get patient back in the bed, NS still infusing wide open.

## 2016-10-06 NOTE — Progress Notes (Signed)
ANTICOAGULATION CONSULT NOTE - follow up  Pharmacy Consult for Heparin >> ELIQUIS Indication: pulmonary embolus  Allergies  Allergen Reactions  . Fish Allergy Other (See Comments)    Mouth and lips swell  . Tomato Swelling   Patient Measurements: Height:  (170.2 cm) Weight: 239 lb 6.7 oz (108.6 kg) IBW/kg (Calculated) : 61.6 HEPARIN DW (KG): 86.5  Vital Signs: Temp: 98.3 F (36.8 C) (09/17 0549) Temp Source: Oral (09/17 0549) BP: 133/92 (09/17 0549) Pulse Rate: 78 (09/17 0549)  Labs:  Recent Labs  10/04/16 1305 10/04/16 1718 10/04/16 2048 10/04/16 2049 10/05/16 0520 10/05/16 0521 10/06/16 0548  HGB 11.4*  --   --   --  11.1*  --   --   HCT 35.0*  --   --   --  34.1*  --   --   PLT 208  --   --   --  211  --   --   LABPROT 13.6  --   --   --   --   --   --   INR 1.05  --   --   --   --   --   --   HEPARINUNFRC  --   --  0.88*  --   --  0.32 <0.10*  CREATININE 0.72  --   --   --   --   --   --   TROPONINI  --  <0.03  --  <0.03 <0.03  --   --    Estimated Creatinine Clearance: 86.6 mL/min (by C-G formula based on SCr of 0.72 mg/dL).  Medical History: Past Medical History:  Diagnosis Date  . Arthritis   . Asthma   . DVT (deep venous thrombosis) (HCC)   . Hypertension    Medications:  See med rec  Assessment: 66 yo female brought into ED with left sided abdomina/rib pain that was worse with movement and breathing. CT scan shows left -sided PE.  Pt has been on Heparin, now asked to transition to Apixaban.  Goal of Therapy:  Heparin level 0.3-0.7 units/ml Monitor platelets by anticoagulation protocol: Yes   Plan:  Apixaban  po BID x 7 days then  po bid Provide education Monitor CBC, s/sx bleeding complications  Valrie Hart, PharmD Clinical Pharmacist Pager:  [phone] 10/06/2016  10/06/2016,12:30 PM

## 2016-10-06 NOTE — Progress Notes (Signed)
Patient's BP 136/69 P 71, awake, alert, Dr. Arbutus Leas notified of VS, in to assess patient, ordered a total bolus NS 500cc's then saline lock. Chest X-ray already done, patient preparing to go to Nuclear Medicine for bone scan, was able to get in the wheelchair without any complications, states "I feel a lot better." Patient asked me to call her granddaughter Toni Amend to let her know what happened.

## 2016-10-06 NOTE — Progress Notes (Signed)
Back in the bed, patient awake, alert, oriented, skin no longer clammy, BP 119/66, patient reports feeling better.

## 2016-10-06 NOTE — Progress Notes (Addendum)
PROGRESS NOTE  Cristina Munoz:[id number] DOB: Nov 11, 1950 DOA: 10/04/2016 PCP: Patient, No Pcp Per Brief History:  66 y.o.femalewith medical history ofessential hypertension presenting with left-sided chest pain in the left lower anterior and posterior rib area that began on 10/01/2016. She describes it as sharp in nature that was worse with any type of movement as well as breathing, coughing, sneezing. She states that it improved over a period of the next 1-2 days, but it returned on the evening of 10/03/2016. The left sided pain became more constant and greater in intensity prompting the patient come to the emergency department for further evaluation. She states that she has been more short of breath than usual.  She has not seen a physician in over 10 years. Workup in the emergency department including CTA of chest revealed left-sided segmental and subsegmental emboli in the left upper lobe and right upper lobe with a trace pleural effusion.  Interestingly, the patient states that she had a DVT approximately 10 years ago. She took warfarin for a "few"months, and she subsequently stopped taking warfarin on her own because she was feeling better and also related to loss of her insurance benefits. The patient does not recall the circumstances surrounding the diagnosis of a DVT.  Assessment/Plan: Acute PE -appears unprovoked by clinical history -Echo-EF 65%, no WMA, normal RV function, mild MR -continue IV heparin-->po apixaban -patient will need lifelong anticoagulation -check factor V leiden--pending -check prothrombin gene mutation--pending -check lupus anticoagulant--pending  T2, T3, T5 Lucencies -SPEP--results pending -serum immunofixation--results pending -Bone scan--neg for neoplastic disease -skeletal survey if serum immunofixation suggest myeloma  VasoVagal reaction -Patient had diaphoresis and hypotension sitting on the commode and afternoon 10/06/2016 -Blood  pressure dropped to 66/52 -Telemetry reviewed--no dysrhythmias -Improved with 500 mL normal saline -Patient now asymptomatic -personally reviewed EKG--sinus, nonspecific T-wave changes -personally reviewed CXR--poor inspiration, left effusion -CMP, CBC unremarkable  Pleuritic chest pain -due to PE -fentanyl prn pain -cycle troponins--neg x 3  Essential hypertension -monitor off of meds for now -BP remains acceptable -pain likely contributing to elevated BP   Disposition Plan:   Home 9/18 if stable Family Communication:   Daughter and grand daughter updated at bedside--Total time spent 35 minutes.  Greater than 50% spent face to face counseling and coordinating care.   Consultants:  none  Code Status:  FULL  DVT Prophylaxis: apixaban   Procedures: As Listed in Progress Note Above  Antibiotics: None   Subjective: The patient had a vagal reaction while on the commode today resulting in diaphoresis and hypotension. Improved after IV fluids. Presently denies new chest pain, shortness breath, nausea, vomiting, diarrhea, abdominal pain, headache, neck pain. She complains of chronic left sided pleuritic chest pain  Objective: Vitals:   10/06/16 1240 10/06/16 1246 10/06/16 1300 10/06/16 1420  BP: 96/60 119/66 136/69 (!) 143/71  Pulse: 75 69 71 83  Resp:      Temp:    98.1 F (36.7 C)  TempSrc:    Oral  SpO2:    95%  Weight:      Height:        Intake/Output Summary (Last 24 hours) at 10/06/16 1646 Last data filed at 10/06/16 0900  Gross per 24 hour  Intake              600 ml  Output                0 ml  Net  600 ml   Weight change:  Exam:   General:  Pt is alert, follows commands appropriately, not in acute distress  HEENT: No icterus, No thrush, No neck mass, [place]/AT  Cardiovascular: RRR, S1/S2, no rubs, no gallops  Respiratory: CTA bilaterally, no wheezing, no crackles, no rhonchi  Abdomen: Soft/+BS, non tender, non distended, no  guarding  Extremities: No edema, No lymphangitis, No petechiae, No rashes, no synovitis   Data Reviewed: I have personally reviewed following labs and imaging studies Basic Metabolic Panel:  Recent Labs Lab 10/04/16 1305 10/06/16 1342  NA 142 136  K 4.0 4.2  CL 107 103  CO2 29 24  GLUCOSE 111* 150*  BUN 11 10  CREATININE 0.72 0.85  CALCIUM 9.0 8.5*   Liver Function Tests:  Recent Labs Lab 10/04/16 1305 10/06/16 1342  AST 12* 15  ALT 7* 9*  ALKPHOS 88 81  BILITOT 0.5 0.7  PROT 7.0 6.8  ALBUMIN 3.5 3.2*    Recent Labs Lab 10/04/16 1305  LIPASE 24   No results for input(s): AMMONIA in the last 168 hours. Coagulation Profile:  Recent Labs Lab 10/04/16 1305  INR 1.05   CBC:  Recent Labs Lab 10/04/16 1305 10/05/16 0520 10/06/16 1342  WBC 6.9 9.4 9.4  NEUTROABS 4.5 7.2  --   HGB 11.4* 11.1* 11.3*  HCT 35.0* 34.1* 34.1*  MCV 87.3 87.9 86.8  PLT 208 211 197   Cardiac Enzymes:  Recent Labs Lab 10/04/16 1718 10/04/16 2049 10/05/16 0520 10/06/16 1342  TROPONINI <0.03 <0.03 <0.03 <0.03   BNP: Invalid input(s): POCBNP CBG:  Recent Labs Lab 10/06/16 1238  GLUCAP 114*   HbA1C: No results for input(s): HGBA1C in the last 72 hours. Urine analysis:    Component Value Date/Time   COLORURINE YELLOW 10/04/2016 1210   APPEARANCEUR CLEAR 10/04/2016 1210   LABSPEC 1.015 10/04/2016 1210   PHURINE 8.0 10/04/2016 1210   GLUCOSEU NEGATIVE 10/04/2016 1210   HGBUR NEGATIVE 10/04/2016 1210   BILIRUBINUR NEGATIVE 10/04/2016 1210   KETONESUR NEGATIVE 10/04/2016 1210   PROTEINUR NEGATIVE 10/04/2016 1210   UROBILINOGEN 0.2 12/29/2006 1435   NITRITE NEGATIVE 10/04/2016 1210   LEUKOCYTESUR NEGATIVE 10/04/2016 1210   Sepsis Labs: (procalcitonin:4,lacticidven:4) )No results found for this or any previous visit (from the past 240 hour(s)).   Scheduled Meds: . apixaban  10 mg Oral BID   Followed by  . [START ON 10/13/2016] apixaban  5 mg Oral  BID   Continuous Infusions:  Procedures/Studies: Ct Angio Chest Pe W And/or Wo Contrast  Result Date: 10/04/2016 CLINICAL DATA:  66 year old female with a history of left-sided abdominal rib pain. EXAM: CT ANGIOGRAPHY CHEST WITH CONTRAST TECHNIQUE: Multidetector CT imaging of the chest was performed using the standard protocol during bolus administration of intravenous contrast. Multiplanar CT image reconstructions and MIPs were obtained to evaluate the vascular anatomy. CONTRAST:  100 cc Isovue 370 COMPARISON:  12/20/2011 FINDINGS: Cardiovascular: Heart: No cardiomegaly. No pericardial fluid/thickening. No significant coronary calcifications. Diameter of right ventricle to left ventricle measures less than 1. No inversion of the interventricular septum. No enlarged right ventricle. No reflux of contrast into the hepatic IVC. Aorta: Unremarkable course, caliber, contour of the thoracic aorta. No aneurysm or dissection flap. No periaortic fluid. Minimal calcifications of the aortic arch. Branch vessels remain patent. Pulmonary arteries: Filling defects of left-sided segmental, subsegmental vessels of the left upper lobe and right upper lobe. No main pulmonary artery filling defect. No right lobar filling defects. No right-sided segmental or  subsegmental emboli identified. Trace left pleural effusion. Mediastinum/Nodes: No mediastinal adenopathy. Unremarkable appearance of the thoracic inlet and thyroid. Lungs/Pleura: Mixed ground-glass and nodular opacity of the left lower lobe, an the distribution related to pulmonary emboli. Small pleural effusion. No pneumothorax. Upper Abdomen: Unremarkable. Musculoskeletal: No acute displaced fracture. There are small lucent lesions within the vertebral bodies of T2 and T3. Lucent lesion of the spinous process of T5. No fracture identified. Review of the MIP images confirms the above findings. IMPRESSION: Study is positive for left-sided pulmonary embolism involving  segmental and subsegmental vessels. No evidence of right-sided heart strain. Mixed in linear and nodular changes of the left lower lobe, compatible with consolidation in the distribution of affected pulmonary vessels. Trace reactive left pleural effusion. Small lucent lesions within the vertebral body of T2 and T3, and the posterior elements of T5. These are nonspecific, though could represent lytic lesions in the setting of multiple on myeloma or other metastatic disease. Correlation with lab values and potentially bone scan may be useful if there is concern for malignancy as a risk factors for the patient's pulmonary embolism. Results called by telephone at the time of interpretation on 10/04/2016 at 2:30 pm to Dr. Alona Bene , who verbally acknowledged these results. Electronically Signed   By: Gilmer Mor D.O.   On: 10/04/2016 14:33   Nm Bone Scan Whole Body  Result Date: 10/06/2016 CLINICAL DATA:  Left Rib and flank pain since last Saturday. Pain happens upon coughing and sneezing. Abnormal xray of L/S spine, bone destruction. No previous history of cancer. Or falls EXAM: NUCLEAR MEDICINE WHOLE BODY BONE SCAN TECHNIQUE: Whole body anterior and posterior images were obtained approximately 3 hours after intravenous injection of radiopharmaceutical. RADIOPHARMACEUTICALS:  20.1 mCi Technetium-35m MDP IV COMPARISON:  Chest CT, 10/04/2016. FINDINGS: There are no focal areas of radiotracer accumulation to suggest a fracture or neoplastic disease to bone. There is uptake involving the spine, both shoulders, the sternoclavicular joints, sternomanubrial joint, both hips, both knees and the feet and ankles, all of which appears degenerative in origin. Renal uptake is symmetric. IMPRESSION: 1. No evidence of a fracture or of neoplastic disease to bone. 2. Multiple areas of degenerative uptake as detailed. Electronically Signed   By: Amie Portland M.D.   On: 10/06/2016 13:39   Dg Chest Port 1 View  Result Date:  10/06/2016 CLINICAL DATA:  66 year old female with recently diagnosed pulmonary embolism. Hypotension. EXAM: PORTABLE CHEST 1 VIEW COMPARISON:  Chest CTA 10/04/2016 and earlier. FINDINGS: Portable AP semi upright view at 1251 hours. Lower lung volumes with new confluent left lung base opacity. Probable small left pleural effusion. Mediastinal contours appear stable. Mild increased crowding of markings elsewhere but no other confluent opacity. No pneumothorax. Visualized tracheal air column is within normal limits. Paucity of bowel gas in the upper abdomen. IMPRESSION: 1. Worsening ventilation at the left lung base since 10/04/2016 could reflect progressive pulmonary infarct and/or pleural effusion in this clinical setting. 2. Lower lung volumes. Electronically Signed   By: Odessa Fleming M.D.   On: 10/06/2016 13:08   Dg Abdomen Acute W/chest  Result Date: 10/04/2016 CLINICAL DATA:  Chest pain and flank pain EXAM: DG ABDOMEN ACUTE W/ 1V CHEST COMPARISON:  Chest CT 12/20/2011 Chest radiograph 12/20/2011 FINDINGS: Lungs are well inflated without focal consolidation. Mild pulmonary vascular congestion without overt pulmonary edema. Cardiomediastinal contours are normal. No pleural effusion or pneumothorax. No free intraperitoneal air. There is a calcified fibroid in the right hemipelvis that measures approximately  4.6 cm. There are no calcifications overlying the renal shadows or the expected courses of the ureters. IMPRESSION: 1. Mild pulmonary vascular congestion without overt edema. 2. No free intraperitoneal air. 3. No visible nephroureterolithiasis. Electronically Signed   By: Deatra Robinson M.D.   On: 10/04/2016 13:00    Iveth Heidemann, DO  [facility] Pager [phone]  If 7PM-7AM, please contact night-coverage [email or web address] Password [email or web address] 10/06/2016, 4:46 PM   LOS: 2 days

## 2016-10-06 NOTE — Care Management Note (Signed)
Case Management Note  Patient Details  Name: Cristina Munoz MRN: [id number] Date of Birth: 09/28/1950  Subjective/Objective:                  Admitted with PE. Pt is from home, she is ind with ADL's. She has insurance with drug coverage but no PCP. She has been relatively healthy for many years and has not had a need to see a MD. Pt understands she will need follow up. Granddaughter has been attempting to find pt PCP. Pt gave CM permission to call gradaughter Toni Amend). Pt will DC on Eliquis.  Action/Plan: Pt given Eliquis voucher for 30-day free. Granddaughter wanted pt to establish care at [facility]. CM has called and pt has been made f/u appointment. Toni Amend will pick up new-pt packet after work today.   Expected Discharge Date:     10/07/2016             Expected Discharge Plan:  Home/Self Care  In-House Referral:  NA  Discharge planning Services  CM Consult  Post Acute Care Choice:  NA Choice offered to:  NA  Status of Service:  Completed, signed off  Malcolm Metro, RN 10/06/2016, 1:38 PM

## 2016-10-06 NOTE — Progress Notes (Signed)
ANTICOAGULATION CONSULT NOTE - follow up  Pharmacy Consult for Heparin Indication: pulmonary embolus  Allergies  Allergen Reactions  . Fish Allergy Other (See Comments)    Mouth and lips swell  . Tomato Swelling   Patient Measurements: Height:  (170.2 cm) Weight: 239 lb 6.7 oz (108.6 kg) IBW/kg (Calculated) : 61.6 HEPARIN DW (KG): 86.5  Vital Signs: Temp: 98.3 F (36.8 C) (09/17 0549) Temp Source: Oral (09/17 0549) BP: 133/92 (09/17 0549) Pulse Rate: 78 (09/17 0549)  Labs:  Recent Labs  10/04/16 1305 10/04/16 1718 10/04/16 2048 10/04/16 2049 10/05/16 0520 10/05/16 0521 10/06/16 0548  HGB 11.4*  --   --   --  11.1*  --   --   HCT 35.0*  --   --   --  34.1*  --   --   PLT 208  --   --   --  211  --   --   LABPROT 13.6  --   --   --   --   --   --   INR 1.05  --   --   --   --   --   --   HEPARINUNFRC  --   --  0.88*  --   --  0.32 <0.10*  CREATININE 0.72  --   --   --   --   --   --   TROPONINI  --  <0.03  --  <0.03 <0.03  --   --    Estimated Creatinine Clearance: 86.6 mL/min (by C-G formula based on SCr of 0.72 mg/dL).  Medical History: Past Medical History:  Diagnosis Date  . Arthritis   . Asthma   . DVT (deep venous thrombosis) (HCC)   . Hypertension    Medications:  See med rec  Assessment: 66 yo female brought into ED with left sided abdomina/rib pain that was worse with movement and breathing. CT scan shows left -sided PE. Plan to start heparin. Heparin level is subtherapeutic this AM.  Goal of Therapy:  Heparin level 0.3-0.7 units/ml Monitor platelets by anticoagulation protocol: Yes   Plan:  Increase heparin infusion to 1400 units/hr Check anti-Xa level in 6-8 hrs and daily while on heparin Continue to monitor H&H and platelets, CBC daily F/U plan for oral anticaogulation  Valrie Hart, PharmD Clinical Pharmacist Pager:  [phone] 10/06/2016  10/06/2016,8:37 AM

## 2016-10-07 DIAGNOSIS — I959 Hypotension, unspecified: Secondary | ICD-10-CM

## 2016-10-07 LAB — BASIC METABOLIC PANEL
Anion gap: 6 (ref 5–15)
BUN: 8 mg/dL (ref 6–20)
CALCIUM: 8.6 mg/dL — AB (ref 8.9–10.3)
CHLORIDE: 104 mmol/L (ref 101–111)
CO2: 27 mmol/L (ref 22–32)
CREATININE: 0.66 mg/dL (ref 0.44–1.00)
GFR calc Af Amer: >60 mL/min (ref >60)
GFR calc non Af Amer: >60 mL/min (ref >60)
Glucose, Bld: 105 mg/dL — ABNORMAL HIGH (ref 65–99)
Potassium: 3.8 mmol/L (ref 3.5–5.1)
SODIUM: 137 mmol/L (ref 135–145)

## 2016-10-07 LAB — LUPUS ANTICOAGULANT PANEL
DRVVT: 38.5 s (ref 0.0–47.0)
PTT LA: 44 s (ref 0.0–51.9)

## 2016-10-07 LAB — MULTIPLE MYELOMA PANEL, SERUM
ALBUMIN/GLOB SERPL: 1.1 (ref 0.7–1.7)
Albumin SerPl Elph-Mcnc: 3.2 g/dL (ref 2.9–4.4)
Alpha 1: 0.3 g/dL (ref 0.0–0.4)
Alpha2 Glob SerPl Elph-Mcnc: 0.6 g/dL (ref 0.4–1.0)
B-GLOBULIN SERPL ELPH-MCNC: 1 g/dL (ref 0.7–1.3)
Gamma Glob SerPl Elph-Mcnc: 1.4 g/dL (ref 0.4–1.8)
Globulin, Total: 3.2 g/dL (ref 2.2–3.9)
IgA: 199 mg/dL (ref 87–352)
IgG (Immunoglobin G), Serum: 1253 mg/dL (ref 700–1600)
IgM (Immunoglobulin M), Srm: 200 mg/dL (ref 26–217)
TOTAL PROTEIN ELP: 6.4 g/dL (ref 6.0–8.5)

## 2016-10-07 LAB — CBC
HCT: 33.7 % — ABNORMAL LOW (ref 36.0–46.0)
HEMOGLOBIN: 10.8 g/dL — AB (ref 12.0–15.0)
MCH: 27.8 pg (ref 26.0–34.0)
MCHC: 32 g/dL (ref 30.0–36.0)
MCV: 86.9 fL (ref 78.0–100.0)
PLATELETS: 224 10*3/uL (ref 150–400)
RBC: 3.88 MIL/uL (ref 3.87–5.11)
RDW: 14.2 % (ref 11.5–15.5)
WBC: 7.3 10*3/uL (ref 4.0–10.5)

## 2016-10-07 LAB — TROPONIN I: Troponin I: <0.03 ng/mL (ref <0.03)

## 2016-10-07 NOTE — Care Management Important Message (Signed)
Important Message  Patient Details  Name: Cristina Munoz MRN: [id number] Date of Birth: [birth year]   Medicare Important Message Given:  Yes    Malcolm Metro, RN 10/07/2016, 12:02 PM

## 2016-10-07 NOTE — Progress Notes (Signed)
Discharge instructions and prescriptions given, verbalized understanding, out in stable condition via w/c with staff. 

## 2016-10-09 LAB — PROTHROMBIN GENE MUTATION

## 2016-10-10 LAB — FACTOR 5 LEIDEN

## 2016-10-22 ENCOUNTER — Ambulatory Visit (INDEPENDENT_AMBULATORY_CARE_PROVIDER_SITE_OTHER): Payer: Medicare Other | Admitting: Family Medicine

## 2016-10-22 ENCOUNTER — Encounter: Payer: Self-pay | Admitting: Family Medicine

## 2016-10-22 VITALS — BP 140/82 | HR 67 | Resp 18 | Ht 67.0 in | Wt 235.0 lb

## 2016-10-22 DIAGNOSIS — I2699 Other pulmonary embolism without acute cor pulmonale: Secondary | ICD-10-CM | POA: Diagnosis not present

## 2016-10-22 DIAGNOSIS — T783XXA Angioneurotic edema, initial encounter: Secondary | ICD-10-CM

## 2016-10-22 DIAGNOSIS — J4520 Mild intermittent asthma, uncomplicated: Secondary | ICD-10-CM

## 2016-10-22 DIAGNOSIS — T782XXD Anaphylactic shock, unspecified, subsequent encounter: Secondary | ICD-10-CM

## 2016-10-22 MED ORDER — APIXABAN 5 MG PO TABS: 5.0000 mg | ORAL_TABLET | Freq: Two times a day (BID) | ORAL | 0 refills | Status: DC

## 2016-10-22 MED ORDER — ALBUTEROL SULFATE HFA 108 (90 BASE) MCG/ACT IN AERS: 2.0000 | INHALATION_SPRAY | Freq: Four times a day (QID) | RESPIRATORY_TRACT | 0 refills | Status: AC | PRN

## 2016-10-22 MED ORDER — EPINEPHRINE 0.3 MG/0.3ML IJ SOAJ: 1.0000 mg | Freq: Once | INTRAMUSCULAR | 1 refills | Status: AC

## 2016-10-22 NOTE — Progress Notes (Signed)
Patient ID: Cristina Munoz, female    DOB: 1950/10/10, 66 y.o.   MRN: [id number]  Chief Complaint  Patient presents with  . Establish Care    new patient    Allergies Fish allergy and Tomato  Subjective:   Cristina Munoz is a 66 y.o. female who presents to [facility] today.  HPI Here to establish care. Reports that lives in [place]. Reports that went out of work b/c of back pain and could not get disability. Reports that has a widows pension. Is going to be living in a senior apartment on [address]. Reports that is here for a hospital follow up. Was admitted for a pulmonary embolism.   Reports that has been to the ED several times for angioedema and allergic reactions to food. Has caused throat and lips to swell. Then gets put on benadryl, steroids, and they keep her in the Ed for a while. Now takes the benadryl everyday to help with the allergy. Has not seen the allergist. Does not have an epi pen at home. Now cannot eat tomato, fish, tomato sauce, b/c it happens. Happened last week, when she ate something and lips swelled up for three days. Reports that when it happens that kids have to run her up to the ED.   Has a history of asthma. Used to use albuterol as needed. Ran out of medication and did not get it filled. Used it as needed. Has not had pneumonia before.   Has not had labs to figure out why got blood clots. Reports that 2 sons have had clots, brother has had clots, father had clots. Did not get clots when she got pregnant.   Allergic Reaction  This is a recurrent problem. The current episode started more than 1 week ago. The problem occurs intermittently. The problem is unchanged. The problem is moderate. The patient was exposed to food and unknown. The time of exposure is unknown. The exposure occurred at home. Pertinent negatives include no abdominal pain, chest pain, coughing, diarrhea, drooling, itching, stridor, vomiting or wheezing. Swelling is  present on the face and lips. Past treatments include diphenhydramine, epinephrine and oral corticosteroid. The treatment provided significant relief. Her past medical history is significant for asthma and food allergies. There is no history of medication allergies or seasonal allergies.    Past Medical History:  Diagnosis Date  . Arthritis   . Asthma   . DVT (deep venous thrombosis) (HCC)   . Hypertension     History reviewed. No pertinent surgical history.  Family History  Problem Relation Age of Onset  . Hypertension Mother   . Diabetes Mother   . Dementia Mother      Social History   Social History  . Marital status: Widowed    Spouse name: N/A  . Number of children: N/A  . Years of education: N/A   Occupational History  . retired    Social History Main Topics  . Smoking status: Never Smoker  . Smokeless tobacco: Never Used  . Alcohol use No  . Drug use: No  . Sexual activity: No   Other Topics Concern  . None   Social History Narrative   Widowed. Has a living son and daughter. Does not smoke. Attends church. Has grandchildren, six.     Review of Systems  Constitutional: Negative for activity change, appetite change, chills, diaphoresis, fatigue, fever and unexpected weight change.  HENT: Negative for drooling.   Eyes: Negative for visual  disturbance.  Respiratory: Negative for cough, choking, chest tightness, wheezing and stridor.        Reports that since she has been home from the hospital that if she is active doing housework that she can get winded. Does not wake up at night SOB. Breathes well most of time. No cough.   Cardiovascular: Negative for chest pain and palpitations.  Gastrointestinal: Negative for abdominal pain, anal bleeding, constipation, diarrhea, nausea, rectal pain and vomiting.  Endocrine: Negative for polyphagia and polyuria.  Genitourinary: Negative for dysuria and hematuria.  Skin: Negative for itching.  Allergic/Immunologic:  Positive for food allergies.  Neurological: Negative for dizziness, light-headedness and headaches.  Hematological: Negative for adenopathy.  Psychiatric/Behavioral: Negative for behavioral problems and dysphoric mood. The patient is not nervous/anxious.      Objective:   BP 140/82   Pulse 67   Resp 18   Ht  (1.702 m)   Wt 235 lb (106.6 kg)   SpO2 96%   BMI 36.81 kg/m   Physical Exam  Constitutional: She is oriented to person, place, and time. She appears well-developed and well-nourished.  HENT:  Head: Normocephalic and atraumatic.  Nose: Nose normal.  Mouth/Throat: Oropharynx is clear and moist. No oropharyngeal exudate.  Eyes: Pupils are equal, round, and reactive to light. Conjunctivae and EOM are normal.  Neck: Normal range of motion. Neck supple. No JVD present. No tracheal deviation present.  Cardiovascular: Normal rate, regular rhythm, normal heart sounds, intact distal pulses and normal pulses.   1 + edema in LE, reports that this is her normal.   Pulmonary/Chest: Effort normal and breath sounds normal. No stridor. No respiratory distress. She has no wheezes.  Abdominal: Soft. Bowel sounds are normal. She exhibits no distension.  Lymphadenopathy:    She has no cervical adenopathy.  Neurological: She is alert and oriented to person, place, and time. No cranial nerve deficit or sensory deficit. Coordination normal.  Skin: Skin is warm and dry.  Psychiatric: She has a normal mood and affect. Her behavior is normal. Judgment and thought content normal.  Nursing note and vitals reviewed.    Assessment and Plan   1. Pulmonary embolism on left [facility]) Discussed and reviewed the labs, CT, and hospital visit. Patient understands the need to be compliant with the medication. Patient counseled in detail regarding the risks of medication. Told to call or return to clinic if develop any worrisome signs or symptoms. Patient voiced understanding.   - apixaban (ELIQUIS) 5 MG  TABS tablet; Take 1 tablet (5 mg total) by mouth 2 (two) times daily. Starting 10/13/16, take 1 tablet (5 mg) two times daily  Dispense: 72 tablet; Refill: 0  2. Anaphylaxis, subsequent encounter Patient has had repeated episodes of allergic reactions and angioedema with swelling in throat and breathing issues. Has required multiple ED visits and treatment with steroids, benadryl, and medications in ED.  Discussed that she needs work up by allergist.   - Ambulatory referral to Allergy  - EPINEPHrine 0.3 mg/0.3 mL IJ SOAJ injection; Inject 1 mL (1 mg total) into the muscle once.  Dispense: 2 Device; Refill: 1  Discussed when it is appropriate to use the epi pen. Discussed use of it and that if she does use it she MUST be seen in Ed b/c symptoms can return. We discussed s/s of anaphylaxis.  3. Angioedema of lips, initial encounter See above - Ambulatory referral to Allergy  4. Mild intermittent asthma without complication Stable. But needs her rescue MDI.  -  albuterol (PROVENTIL HFA;VENTOLIN HFA) 108 (90 Base) MCG/ACT inhaler; Inhale 2 puffs into the lungs every 6 (six) hours as needed for wheezing or shortness of breath.  Dispense: 1 Inhaler; Refill: 0  Patient defers labs today, but will check at her follow up. She will come in fasting to the visit.  She needs CPE and screening.  Spent greater than 50% of OV counseling on above.  Records reviewed.  Return in about 4 weeks (around 11/19/2016) for follow up. Aliene Beams, MD 10/22/2016

## 2016-10-22 NOTE — Patient Instructions (Signed)
Anaphylactic Reaction, Adult An anaphylactic reaction (anaphylaxis) is a sudden, severe allergic reaction that affects multiple areas of the body. Affected areas of the body may include the skin, mouth, lungs, heart, or gut (digestive system). Anaphylaxis can be life-threatening. This condition requires immediate medical attention, and sometimes hospitalization. What are the causes? This condition is caused by exposure to a substance that you are allergic to (allergen). In response to this exposure, the body releases proteins (antibodies) and other compounds, such as histamine, into the bloodstream. This causes swelling in certain tissues and loss of blood pressure to important areas, such as the heart and lungs. Common allergens that can cause anaphylaxis include:  Medicines.  Foods, especially peanuts, wheat, shellfish, milk, and eggs.  Insect bites or stings.  Blood or parts of blood, including plasma, immunoglobulins, or serum.  Chemicals, such as dyes, latex, and contrast material that is used for medical tests.  What increases the risk? This condition is more likely to occur in people who:  Have allergies.  Have had anaphylaxis before.  Have a family history of anaphylaxis.  Have certain medical conditions, including asthma and eczema.  What are the signs or symptoms? Symptoms of anaphylaxis include:  Nasal congestion.  Headache.  Flushed face.  Tingling in the mouth.  An itchy, red rash.  Swelling of the eyes, lips, face, or tongue.  Swelling of the back of the mouth and the throat.  Wheezing.  A hoarse voice.  Itchy, red, swollen areas of skin (hives).  Dizziness or light-headedness.  Fainting.  Anxiety or confusion.  Abdominal or chest pain.  Difficulty breathing, speaking, or swallowing.  Chest or throat tightness.  Fast or irregular heartbeats (palpitations).  Vomiting.  Diarrhea.  How is this diagnosed? This condition is diagnosed based  on a physical exam and your history of recent exposure to allergens. You may be referred for follow-up testing by a health care provider who specializes in allergies. This testing can confirm the diagnosis and determine which substances you are allergic to. Testing may include:  Skin tests. These may involve: ? Injecting a small amount of the possible allergen between layers of your skin (intradermal injection). ? Applying patches to your skin.  Blood tests.  How is this treated? Emergency treatment may include:  Medicines that help: ? To relieve itching and hives (antihistamines). ? To reduce swelling (corticosteroids). ? To tighten your blood vessels and increase your heart rate (epinephrine).  Oxygen therapy to help you breathe.  Giving fluids through an IV tube.  Your health care provider may teach you how to use an anaphylaxis kit and how to give yourself an epinephrine injection with what is commonly called an auto-injector "pen" (pre-filled automatic epinephrine injection device). If you think that you are having an anaphylactic reaction, you should use an auto-injector pen or an anaphylaxis kit. If you use epinephrine, you must still seek emergency medical treatment. Follow these instructions at home: Safety  Always keep an auto-injector pen or an anaphylaxis kit near you. These can be lifesaving if you have a severe anaphylactic reaction. Use your auto-injector pen or anaphylaxis kit as told by your health care provider.  Do not drive until your health care provider approves.  Make sure that you, the members of your household, and your employer know: ? How to use an anaphylaxis kit. ? How to use an auto-injector pen to give you an epinephrine injection.  Replace your epinephrine immediately after you use your auto-injector pen, in case you have  another reaction.  Wear a medical alert bracelet or necklace that states your allergy, if told by your health care  provider.  Learn the signs of anaphylaxis.  Work with your health care providers to come up with an anaphylaxis plan. Preparation is important. General instructions  Take over-the-counter and prescription medicines only as told by your health care provider.  If you have hives or a rash: ? Use an over-the-counter antihistamine as told by your health care provider. ? Apply cold, wet cloths (cold compresses) to your skin or take baths or showers in cool water. Avoid hot water.  If you had tests done, it is your responsibility to get your test results. Ask your health care provider or the department performing the tests when your results will be ready.  Tell any health care providers who care for you that you have an allergy.  Keep all follow-up visits as told by your health care provider. This is important. How is this prevented?  Avoid allergens that have caused an anaphylactic reaction for you before.  When you are at a restaurant, tell your server that you have an allergy. If you are unsure whether a meal has an ingredient that you are allergic to, ask your server. Contact a health care provider if:  You develop symptoms of an allergic reaction. You may notice them soon after you are exposed to a substance. The symptoms may include: ? Rash. ? Headache. ? Sneezing or a runny nose. ? Swelling. ? Nausea. ? Diarrhea. Get help right away if:  You needed to use epinephrine. ? An epinephrine injection helps to manage life-threatening allergic reactions, but you still need to go to the emergency room even if epinephrine seems to work. This is important because anaphylaxis may happen again within 72 hours (rebound anaphylaxis). ? If you used epinephrine to treat anaphylaxis outside of the hospital, you need additional medical care. This may include more doses of epinephrine.  You develop: ? A tight feeling in your chest or throat. ? Wheezing or difficulty breathing. ? Hives. ? Red  skin or itching all over your body. ? Swelling in your lips, tongue, or the back of your throat.  You have severe vomiting or diarrhea.  You faint or you feel like you are going to faint. These symptoms may represent a serious problem that is an emergency. Do not wait to see if the symptoms will go away. Use your auto-injector pen or anaphylaxis kit as you have been instructed, and get medical help right away. Call your local emergency services (911 in the [place] Do not drive yourself to the hospital. This information is not intended to replace advice given to you by your health care provider. Make sure you discuss any questions you have with your health care provider. Document Released: 01/06/2005 Document Revised: 09/04/2015 Document Reviewed: 07/23/2015 Elsevier Interactive Patient Education  2018 [employer]  

## 2016-11-06 ENCOUNTER — Other Ambulatory Visit: Payer: Self-pay | Admitting: Family Medicine

## 2016-11-06 DIAGNOSIS — I2699 Other pulmonary embolism without acute cor pulmonale: Secondary | ICD-10-CM

## 2016-11-13 ENCOUNTER — Telehealth: Payer: Self-pay | Admitting: Family Medicine

## 2016-11-13 DIAGNOSIS — I2699 Other pulmonary embolism without acute cor pulmonale: Secondary | ICD-10-CM

## 2016-11-13 MED ORDER — APIXABAN 5 MG PO TABS: 5.0000 mg | ORAL_TABLET | Freq: Two times a day (BID) | ORAL | 2 refills | Status: DC

## 2016-11-13 NOTE — Telephone Encounter (Signed)
Done. Advise.

## 2016-11-13 NOTE — Telephone Encounter (Signed)
Patient requesting Rx Eliquix 5mg  (given to her from the [facility].  Pt has f/u 11-19-16 w/Dr. Tracie HarrierHagler.  She states this Rx is for blood clots as she had 2).  [facility].

## 2016-11-13 NOTE — Telephone Encounter (Signed)
Patient informed of message below, verbalized understanding.  

## 2016-11-19 ENCOUNTER — Encounter: Payer: Self-pay | Admitting: Family Medicine

## 2016-11-19 ENCOUNTER — Telehealth: Payer: Self-pay | Admitting: Family Medicine

## 2016-11-19 ENCOUNTER — Ambulatory Visit (INDEPENDENT_AMBULATORY_CARE_PROVIDER_SITE_OTHER): Payer: Medicare Other | Admitting: Family Medicine

## 2016-11-19 VITALS — BP 160/98 | HR 76 | Temp 98.0°F | Resp 16 | Ht 67.0 in | Wt 238.5 lb

## 2016-11-19 DIAGNOSIS — M25562 Pain in left knee: Secondary | ICD-10-CM | POA: Diagnosis not present

## 2016-11-19 DIAGNOSIS — D649 Anemia, unspecified: Secondary | ICD-10-CM | POA: Diagnosis not present

## 2016-11-19 DIAGNOSIS — M25561 Pain in right knee: Secondary | ICD-10-CM | POA: Diagnosis not present

## 2016-11-19 DIAGNOSIS — G8929 Other chronic pain: Secondary | ICD-10-CM | POA: Diagnosis not present

## 2016-11-19 DIAGNOSIS — E782 Mixed hyperlipidemia: Secondary | ICD-10-CM | POA: Diagnosis not present

## 2016-11-19 DIAGNOSIS — R002 Palpitations: Secondary | ICD-10-CM

## 2016-11-19 DIAGNOSIS — I2699 Other pulmonary embolism without acute cor pulmonale: Secondary | ICD-10-CM | POA: Diagnosis not present

## 2016-11-19 DIAGNOSIS — R739 Hyperglycemia, unspecified: Secondary | ICD-10-CM | POA: Diagnosis not present

## 2016-11-19 DIAGNOSIS — T782XXS Anaphylactic shock, unspecified, sequela: Secondary | ICD-10-CM

## 2016-11-19 DIAGNOSIS — T782XXA Anaphylactic shock, unspecified, initial encounter: Secondary | ICD-10-CM | POA: Insufficient documentation

## 2016-11-19 DIAGNOSIS — I10 Essential (primary) hypertension: Secondary | ICD-10-CM

## 2016-11-19 MED ORDER — HYDROCHLOROTHIAZIDE 25 MG PO TABS: 25.0000 mg | ORAL_TABLET | Freq: Every day | ORAL | 3 refills | Status: AC

## 2016-11-19 NOTE — Progress Notes (Signed)
Patient ID: Cristina Munoz, female    DOB: [birth year], 66 y.o.   MRN: [id number]  Chief Complaint  Patient presents with  . Hypertension    Allergies Fish allergy and Tomato  Subjective:   Cristina Mirermanda J Holberg is a 66 y.o. female who [facility].  HPI Ms. Char presents for her follow-up. She is here to have her blood pressure checked because at her last visit she had an elevated blood pressure without the diagnosis of hypertension. Reports that she has been taking her medication for the pulmonary embolism. Is feeling somewhat better since her last visit. Reports that she does not get winded with walking around. She did want to discuss today that she has been having palpitations and she was discharged from the hospital. She reports that she can be at rest or activity and the symptoms will come on. She feels like her heart rate starts to speed up and then it may skip a beat. She denies any associated sweating, nausea, chest pain, lightheadedness, or syncope with these events. She reports she has never had these in the past. Reports when this occurs she usually just sits down and it goes away on its own. Reports she does not drink caffeinated products. Is not taking any herbal supplements. Reports she tries to eat a healthy diet. She tells me that she had [employer] fries and cheeseburgers last night for dinner and she reports she is surprised to find out that that is not a healthy meal. Is still living with her family until she can get furniture for her senior apartment. Denies any tobacco use. Reports that she is not having any palpitations today. Has still had some mild swelling in her extremities. Came today in a fasting state so that she could get her labs done. Was anemic when she was in the hospital and also had elevated blood sugars. Reports she has never been told she was diabetic. Reports she has not had the medication for treatment of the blood clots for 5 days  because she has not gotten it at the pharmacy. Reports she did received the paperwork to go to the allergist but she has not been yet. Has not picked EpiPen up from the pharmacy yet. Has not had to go back to the emergency department for allergic reactions. Denies any swelling of her lips, shortness of breath, or allergic skin rashes since she was last seen here.    Palpitations   This is a new problem. The current episode started more than 1 month ago. The problem occurs daily. The problem has been unchanged. Nothing aggravates the symptoms. Pertinent negatives include no anxiety, chest fullness, chest pain, coughing, diaphoresis, dizziness, fever, irregular heartbeat, malaise/fatigue, nausea, near-syncope, numbness, shortness of breath, syncope, vomiting or weakness. Treatments tried: rests and it goes away. The treatment provided moderate relief. Risk factors include obesity, post menopause and sedentary lifestyle.    Past Medical History:  Diagnosis Date  . Arthritis   . Asthma   . DVT (deep venous thrombosis) (HCC)   . Hypertension     No past surgical history on file.  Family History  Problem Relation Age of Onset  . Hypertension Mother   . Diabetes Mother   . Dementia Mother      Social History   Social History  . Marital status: Widowed    Spouse name: N/A  . Number of children: N/A  . Years of education: N/A   Occupational History  .  retired    Social History Main Topics  . Smoking status: Never Smoker  . Smokeless tobacco: Never Used  . Alcohol use No  . Drug use: No  . Sexual activity: No   Other Topics Concern  . None   Social History Narrative   Widowed. Has a living son and daughter. Does not smoke. Attends church. Has grandchildren, six.     Review of Systems  Constitutional: Negative for diaphoresis, fever and malaise/fatigue.  HENT: Negative for congestion.   Respiratory: Negative for cough, chest tightness, shortness of breath and wheezing.     Cardiovascular: Positive for palpitations. Negative for chest pain, syncope and near-syncope.  Gastrointestinal: Negative for nausea and vomiting.  Musculoskeletal: Positive for arthralgias. Negative for back pain, joint swelling and myalgias.       Has had chronic knee pain for years and was reportedly told by Dr. Romeo Apple that she needed knee replacement surgery. Has not followed up reports that her knees bother her daily and makes it very difficult for her to walk at times.  Skin: Negative for rash.  Neurological: Negative for dizziness, tremors, seizures, weakness, numbness and headaches.  Psychiatric/Behavioral: Negative for behavioral problems, dysphoric mood and sleep disturbance. The patient is not nervous/anxious.        Reports that her mood is good and she does not feel stressed or anxious.   Patient defers EKG today.  Objective:   BP (!) 160/98 (BP Location: Left Arm, Patient Position: Sitting, Cuff Size: Normal)   Pulse 76   Temp 98 F (36.7 C) (Other (Comment))   Resp 16   Ht 5\' 7"  (1.702 m)   Wt 238 lb 8 oz (108.2 kg)   SpO2 98%   BMI 37.35 kg/m   Physical Exam  Constitutional: She is oriented to person, place, and time. She appears well-developed and well-nourished. No distress.  HENT:  Head: Normocephalic and atraumatic.  Eyes: Pupils are equal, round, and reactive to light.  Neck: Trachea normal, normal range of motion and phonation normal. Neck supple. No tracheal tenderness present. No thyroid mass and no thyromegaly present.  Cardiovascular: Normal rate, regular rhythm and normal heart sounds.   Pulses:      Dorsalis pedis pulses are 1+ on the right side, and 1+ on the left side.  Pulmonary/Chest: Effort normal and breath sounds normal. No respiratory distress.  Neurological: She is alert and oriented to person, place, and time. No cranial nerve deficit.  Skin: Skin is warm and dry.  Psychiatric: She has a normal mood and affect. Her speech is normal and  behavior is normal. Thought content normal.  Nursing note and vitals reviewed.    Assessment and Plan  1. Palpitations Uncertain etiology. Will refer to cardiology at this time for evaluation and possible monitor. Check labs today. - Basic metabolic panel - Ambulatory referral to Cardiology - TSH  2. Elevated blood sugar Dietary modifications and lifestyle changes discussed with patient today. She was counseled on what would be considered a heart healthy and low sugar diet. Specific foods were used as examples. - Hemoglobin A1c  3. Anemia, unspecified type Check labs today to see if resolved from hospitalization. - CBC with Differential/Platelet  4. Mixed hyperlipidemia Patient reports a possible remote history, secondary to her other risk factors we'll check this at this time. Information given for patient to read. - Lipid panel  5. Chronic pain of both knees Patient has abnormal gait and pain which affects her mobility. She is agreeable to  following back up with orthopedics at this time. - Ambulatory referral to Orthopedic Surgery  6. Essential hypertension Lifestyle modifications discussed with patient including a diet emphasizing vegetables, fruits, and whole grains. Limiting intake of sodium to less than 2,400 mg per day.  Recommendations discussed include consuming low-fat dairy products, poultry, fish, legumes, non-tropical vegetable oils, and nuts; and limiting intake of sweets, sugar-sweetened beverages, and red meat. Discussed following a plan such as the Dietary Approaches to Stop Hypertension (DASH) diet. Patient to read up on this diet.   - hydrochlorothiazide (HYDRODIURIL) 25 MG tablet; Take 1 tablet (25 mg total) by mouth daily.  Dispense: 90 tablet; Refill: 3 Patient counseled in detail regarding the risks of medication. Told to call or return to clinic if develop any worrisome signs or symptoms. Patient voiced understanding.    7. Other acute pulmonary embolism  without acute cor pulmonale (HCC) Patient was counseled in detail again at this visit that she has to take her medication daily for the pulmonary embolism. Patient compliance was again discussed. We discussed how this condition can be life-threatening if not treated properly.  8. Anaphylaxis, sequela Patient encouraged to fill her prescription for her EpiPen. She will keep her appointment with allergist.    Patient will follow-up in 4 weeks for blood pressure check. She was told to call with any questions or concerns.  Return in about 4 weeks (around 12/17/2016) for BP. Aliene Beams, MD 11/19/2016

## 2016-11-19 NOTE — Patient Instructions (Signed)
Hypertension Hypertension is another name for high blood pressure. High blood pressure forces your heart to work harder to pump blood. This can cause problems over time. There are two numbers in a blood pressure reading. There is a top number (systolic) over a bottom number (diastolic). It is best to have a blood pressure below 120/80. Healthy choices can help lower your blood pressure. You may need medicine to help lower your blood pressure if:  Your blood pressure cannot be lowered with healthy choices.  Your blood pressure is higher than 130/80.  Follow these instructions at home: Eating and drinking  If directed, follow the DASH eating plan. This diet includes: ? Filling half of your plate at each meal with fruits and vegetables. ? Filling one quarter of your plate at each meal with whole grains. Whole grains include whole wheat pasta, brown rice, and whole grain bread. ? Eating or drinking low-fat dairy products, such as skim milk or low-fat yogurt. ? Filling one quarter of your plate at each meal with low-fat (lean) proteins. Low-fat proteins include fish, skinless chicken, eggs, beans, and tofu. ? Avoiding fatty meat, cured and processed meat, or chicken with skin. ? Avoiding premade or processed food.  Eat less than 1,500 mg of salt (sodium) a day.  Limit alcohol use to no more than 1 drink a day for nonpregnant women and 2 drinks a day for men. One drink equals 12 oz of beer, 5 oz of wine, or 1 oz of hard liquor. Lifestyle  Work with your doctor to stay at a healthy weight or to lose weight. Ask your doctor what the best weight is for you.  Get at least 30 minutes of exercise that causes your heart to beat faster (aerobic exercise) most days of the week. This may include walking, swimming, or biking.  Get at least 30 minutes of exercise that strengthens your muscles (resistance exercise) at least 3 days a week. This may include lifting weights or pilates.  Do not use any  products that contain nicotine or tobacco. This includes cigarettes and e-cigarettes. If you need help quitting, ask your doctor.  Check your blood pressure at home as told by your doctor.  Keep all follow-up visits as told by your doctor. This is important. Medicines  Take over-the-counter and prescription medicines only as told by your doctor. Follow directions carefully.  Do not skip doses of blood pressure medicine. The medicine does not work as well if you skip doses. Skipping doses also puts you at risk for problems.  Ask your doctor about side effects or reactions to medicines that you should watch for. Contact a doctor if:  You think you are having a reaction to the medicine you are taking.  You have headaches that keep coming back (recurring).  You feel dizzy.  You have swelling in your ankles.  You have trouble with your vision. Get help right away if:  You get a very bad headache.  You start to feel confused.  You feel weak or numb.  You feel faint.  You get very bad pain in your: ? Chest. ? Belly (abdomen).  You throw up (vomit) more than once.  You have trouble breathing. Summary  Hypertension is another name for high blood pressure.  Making healthy choices can help lower blood pressure. If your blood pressure cannot be controlled with healthy choices, you may need to take medicine. This information is not intended to replace advice given to you by your health care   provider. Make sure you discuss any questions you have with your health care provider. Document Released: 06/25/2007 Document Revised: 12/05/2015 Document Reviewed: 12/05/2015 Elsevier Interactive Patient Education  2018 [employer] DASH Eating Plan DASH stands for "Dietary Approaches to Stop Hypertension." The DASH eating plan is a healthy eating plan that has been shown to reduce high blood pressure (hypertension). It may also reduce your risk for type 2 diabetes, heart disease, and  stroke. The DASH eating plan may also help with weight loss. What are tips for following this plan? General guidelines  Avoid eating more than 2,300 mg (milligrams) of salt (sodium) a day. If you have hypertension, you may need to reduce your sodium intake to 1,500 mg a day.  Limit alcohol intake to no more than 1 drink a day for nonpregnant women and 2 drinks a day for men. One drink equals 12 oz of beer, 5 oz of wine, or 1 oz of hard liquor.  Work with your health care provider to maintain a healthy body weight or to lose weight. Ask what an ideal weight is for you.  Get at least 30 minutes of exercise that causes your heart to beat faster (aerobic exercise) most days of the week. Activities may include walking, swimming, or biking.  Work with your health care provider or diet and nutrition specialist (dietitian) to adjust your eating plan to your individual calorie needs. Reading food labels  Check food labels for the amount of sodium per serving. Choose foods with less than 5 percent of the Daily Value of sodium. Generally, foods with less than 300 mg of sodium per serving fit into this eating plan.  To find whole grains, look for the word "whole" as the first word in the ingredient list. Shopping  Buy products labeled as "low-sodium" or "no salt added."  Buy fresh foods. Avoid canned foods and premade or frozen meals. Cooking  Avoid adding salt when cooking. Use salt-free seasonings or herbs instead of table salt or sea salt. Check with your health care provider or pharmacist before using salt substitutes.  Do not fry foods. Cook foods using healthy methods such as baking, boiling, grilling, and broiling instead.  Cook with heart-healthy oils, such as olive, canola, soybean, or sunflower oil. Meal planning   Eat a balanced diet that includes: ? 5 or more servings of fruits and vegetables each day. At each meal, try to fill half of your plate with fruits and vegetables. ? Up  to 6-8 servings of whole grains each day. ? Less than 6 oz of lean meat, poultry, or fish each day. A 3-oz serving of meat is about the same size as a deck of cards. One egg equals 1 oz. ? 2 servings of low-fat dairy each day. ? A serving of nuts, seeds, or beans 5 times each week. ? Heart-healthy fats. Healthy fats called Omega-3 fatty acids are found in foods such as flaxseeds and coldwater fish, like sardines, salmon, and mackerel.  Limit how much you eat of the following: ? Canned or prepackaged foods. ? Food that is high in trans fat, such as fried foods. ? Food that is high in saturated fat, such as fatty meat. ? Sweets, desserts, sugary drinks, and other foods with added sugar. ? Full-fat dairy products.  Do not salt foods before eating.  Try to eat at least 2 vegetarian meals each week.  Eat more home-cooked food and less restaurant, buffet, and fast food.  When eating at a restaurant, ask   that your food be prepared with less salt or no salt, if possible. What foods are recommended? The items listed may not be a complete list. Talk with your dietitian about what dietary choices are best for you. Grains Whole-grain or whole-wheat bread. Whole-grain or whole-wheat pasta. Brown rice. Oatmeal. Quinoa. Bulgur. Whole-grain and low-sodium cereals. Pita bread. Low-fat, low-sodium crackers. Whole-wheat flour tortillas. Vegetables Fresh or frozen vegetables (raw, steamed, roasted, or grilled). Low-sodium or reduced-sodium tomato and vegetable juice. Low-sodium or reduced-sodium tomato sauce and tomato paste. Low-sodium or reduced-sodium canned vegetables. Fruits All fresh, dried, or frozen fruit. Canned fruit in natural juice (without added sugar). Meat and other protein foods Skinless chicken or turkey. Ground chicken or turkey. Pork with fat trimmed off. Fish and seafood. Egg whites. Dried beans, peas, or lentils. Unsalted nuts, nut butters, and seeds. Unsalted canned beans. Lean cuts of  beef with fat trimmed off. Low-sodium, lean deli meat. Dairy Low-fat (1%) or fat-free (skim) milk. Fat-free, low-fat, or reduced-fat cheeses. Nonfat, low-sodium ricotta or cottage cheese. Low-fat or nonfat yogurt. Low-fat, low-sodium cheese. Fats and oils Soft margarine without trans fats. Vegetable oil. Low-fat, reduced-fat, or light mayonnaise and salad dressings (reduced-sodium). Canola, safflower, olive, soybean, and sunflower oils. Avocado. Seasoning and other foods Herbs. Spices. Seasoning mixes without salt. Unsalted popcorn and pretzels. Fat-free sweets. What foods are not recommended? The items listed may not be a complete list. Talk with your dietitian about what dietary choices are best for you. Grains Baked goods made with fat, such as croissants, muffins, or some breads. Dry pasta or rice meal packs. Vegetables Creamed or fried vegetables. Vegetables in a cheese sauce. Regular canned vegetables (not low-sodium or reduced-sodium). Regular canned tomato sauce and paste (not low-sodium or reduced-sodium). Regular tomato and vegetable juice (not low-sodium or reduced-sodium). Pickles. Olives. Fruits Canned fruit in a light or heavy syrup. Fried fruit. Fruit in cream or butter sauce. Meat and other protein foods Fatty cuts of meat. Ribs. Fried meat. Bacon. Sausage. Bologna and other processed lunch meats. Salami. Fatback. Hotdogs. Bratwurst. Salted nuts and seeds. Canned beans with added salt. Canned or smoked fish. Whole eggs or egg yolks. Chicken or turkey with skin. Dairy Whole or 2% milk, cream, and half-and-half. Whole or full-fat cream cheese. Whole-fat or sweetened yogurt. Full-fat cheese. Nondairy creamers. Whipped toppings. Processed cheese and cheese spreads. Fats and oils Butter. Stick margarine. Lard. Shortening. Ghee. Bacon fat. Tropical oils, such as coconut, palm kernel, or palm oil. Seasoning and other foods Salted popcorn and pretzels. Onion salt, garlic salt, seasoned  salt, table salt, and sea salt. Worcestershire sauce. Tartar sauce. Barbecue sauce. Teriyaki sauce. Soy sauce, including reduced-sodium. Steak sauce. Canned and packaged gravies. Fish sauce. Oyster sauce. Cocktail sauce. Horseradish that you find on the shelf. Ketchup. Mustard. Meat flavorings and tenderizers. Bouillon cubes. Hot sauce and Tabasco sauce. Premade or packaged marinades. Premade or packaged taco seasonings. Relishes. Regular salad dressings. Where to find more information:  National Heart, Lung, and Blood Institute: www.nhlbi.nih.gov  American Heart Association: www.heart.org Summary  The DASH eating plan is a healthy eating plan that has been shown to reduce high blood pressure (hypertension). It may also reduce your risk for type 2 diabetes, heart disease, and stroke.  With the DASH eating plan, you should limit salt (sodium) intake to 2,300 mg a day. If you have hypertension, you may need to reduce your sodium intake to 1,500 mg a day.  When on the DASH eating plan, aim to eat more fresh fruits   and vegetables, whole grains, lean proteins, low-fat dairy, and heart-healthy fats.  Work with your health care provider or diet and nutrition specialist (dietitian) to adjust your eating plan to your individual calorie needs. This information is not intended to replace advice given to you by your health care provider. Make sure you discuss any questions you have with your health care provider. Document Released: 12/26/2010 Document Revised: 12/31/2015 Document Reviewed: 12/31/2015 Elsevier Interactive Patient Education  2017 Elsevier Inc.  

## 2016-11-19 NOTE — Telephone Encounter (Signed)
Called patients pharmacy with Dr.Haglers permission, gave a verbal for Eliquis Rx. Spoke with scott.    -this rx was faxed by our office on 11/13/16, pharmacy states they didn't receive anything from our office for this patient.

## 2016-11-26 ENCOUNTER — Encounter (HOSPITAL_COMMUNITY): Payer: Self-pay | Admitting: Emergency Medicine

## 2016-11-26 ENCOUNTER — Emergency Department (HOSPITAL_COMMUNITY)
Admission: EM | Admit: 2016-11-26 | Discharge: 2016-11-26 | Disposition: A | Discharge status: Home / Self Care | Payer: Medicare Other | Attending: Emergency Medicine | Admitting: Emergency Medicine

## 2016-11-26 ENCOUNTER — Emergency Department (HOSPITAL_COMMUNITY): Payer: Medicare Other

## 2016-11-26 ENCOUNTER — Other Ambulatory Visit: Payer: Self-pay

## 2016-11-26 DIAGNOSIS — I10 Essential (primary) hypertension: Secondary | ICD-10-CM | POA: Diagnosis not present

## 2016-11-26 DIAGNOSIS — M79605 Pain in left leg: Secondary | ICD-10-CM | POA: Diagnosis present

## 2016-11-26 DIAGNOSIS — I824Z2 Acute embolism and thrombosis of unspecified deep veins of left distal lower extremity: Secondary | ICD-10-CM | POA: Diagnosis not present

## 2016-11-26 DIAGNOSIS — Z79899 Other long term (current) drug therapy: Secondary | ICD-10-CM | POA: Insufficient documentation

## 2016-11-26 DIAGNOSIS — Z7901 Long term (current) use of anticoagulants: Secondary | ICD-10-CM | POA: Insufficient documentation

## 2016-11-26 DIAGNOSIS — J45909 Unspecified asthma, uncomplicated: Secondary | ICD-10-CM | POA: Insufficient documentation

## 2016-11-26 DIAGNOSIS — I82432 Acute embolism and thrombosis of left popliteal vein: Secondary | ICD-10-CM | POA: Diagnosis not present

## 2016-11-26 DIAGNOSIS — I82402 Acute embolism and thrombosis of unspecified deep veins of left lower extremity: Secondary | ICD-10-CM | POA: Diagnosis not present

## 2016-11-26 HISTORY — DX: Other pulmonary embolism without acute cor pulmonale: I26.99

## 2016-11-26 NOTE — ED Provider Notes (Signed)
[facility] EMERGENCY DEPARTMENT Provider Note   CSN: [id number] Arrival date & time: 11/26/16  1048     History   Chief Complaint Chief Complaint  Patient presents with  . Leg Pain    HPI Cristina Munoz is a 66 y.o. female.  She is here for evaluation of left leg pain, which started several days ago and is worsening.  Unfortunately, she recently ran out of Eliquis, because she did not have a prescription, and was off it for about 1 week, restarting on 11/19/16.  She had been diagnosed with a pulmonary embolism about 4 weeks ago, without known source.  She has been compliant with her Eliquis since restarting it.  She does not recall having left leg pain, at the time of her initial diagnosis of pulmonary embolus.  She does have a remote history of leg DVT.  The patient currently denies shortness of breath, chest pain, weakness or dizziness.  She has not had any fever, chills, cough, change in bowel or urinary habits.  There are no other known modifying factors.  HPI  Past Medical History:  Diagnosis Date  . Arthritis   . Asthma   . DVT (deep venous thrombosis) (HCC)   . Hypertension   . Pulmonary embolism [facility])     Patient Active Problem List   Diagnosis Date Noted  . Elevated BP without diagnosis of hypertension 11/19/2016  . Elevated blood sugar 11/19/2016  . Anaphylaxis 11/19/2016  . Syncope   . Pleuritic chest pain 10/05/2016  . Acute pulmonary embolus (HCC) 10/04/2016  . LOWER LEG, ARTHRITIS, DEGEN./OSTEO 03/30/2007  . KNEE PAIN 03/30/2007  . DISC DEGENERATION 03/30/2007  . BACK PAIN 03/30/2007    Past Surgical History:  Procedure Laterality Date  . TUBAL LIGATION      OB History    No data available       Home Medications    Prior to Admission medications   Medication Sig Start Date End Date Taking? Authorizing Provider  albuterol (PROVENTIL HFA;VENTOLIN HFA) 108 (90 Base) MCG/ACT inhaler Inhale 2 puffs into the lungs every 6 (six) hours as needed for  wheezing or shortness of breath. 10/22/16  Yes Hagler, Fleet Contras, MD  apixaban (ELIQUIS) 5 MG TABS tablet Take 1 tablet (5 mg total) by mouth 2 (two) times daily. 11/13/16  Yes Aliene Beams, MD  diphenhydrAMINE (BENADRYL) 25 mg capsule Take 25 mg by mouth every 6 (six) hours as needed for allergies.   Yes [provider]  hydrochlorothiazide (HYDRODIURIL) 25 MG tablet Take 1 tablet (25 mg total) by mouth daily. 11/19/16  Yes Aliene Beams, MD    Family History Family History  Problem Relation Age of Onset  . Hypertension Mother   . Diabetes Mother   . Dementia Mother     Social History Social History   Tobacco Use  . Smoking status: Never Smoker  . Smokeless tobacco: Never Used  Substance Use Topics  . Alcohol use: No  . Drug use: No     Allergies   Fish allergy and Tomato   Review of Systems Review of Systems  All other systems reviewed and are negative.    Physical Exam Updated Vital Signs BP (!) 140/97 (BP Location: Left Arm)   Pulse 88   Temp 98.4 F (36.9 C) (Oral)   Resp 18   SpO2 100%   Physical Exam  Constitutional: She is oriented to person, place, and time. She appears well-developed and well-nourished.  HENT:  Head: Normocephalic and  atraumatic.  Eyes: Conjunctivae and EOM are normal. Pupils are equal, round, and reactive to light.  Neck: Normal range of motion and phonation normal. Neck supple.  Cardiovascular: Normal rate and regular rhythm.  Pulmonary/Chest: Effort normal and breath sounds normal. She exhibits no tenderness.  Abdominal: Soft. She exhibits no distension. There is no tenderness. There is no guarding.  Musculoskeletal: Normal range of motion.  Mild swelling left leg, as compared to right.  Diffusely tender left leg, thigh, popliteal region and calf.  Normal peripheral perfusion in the toes bilaterally.  Neurological: She is alert and oriented to person, place, and time. She exhibits normal muscle tone.  Skin: Skin is warm  and dry.  Psychiatric: She has a normal mood and affect. Her behavior is normal. Judgment and thought content normal.  Nursing note and vitals reviewed.    ED Treatments / Results  Labs (all labs ordered are listed, but only abnormal results are displayed) Labs Reviewed - No data to display  EKG  EKG Interpretation None       Radiology [place] Venous Img Lower Unilateral Left  Result Date: 11/26/2016 CLINICAL DATA:  History of DVT and pulmonary embolism, now with left lower extremity pain and swelling for the past 3 days. Evaluate for DVT. EXAM: LEFT LOWER EXTREMITY VENOUS DOPPLER ULTRASOUND TECHNIQUE: Gray-scale sonography with graded compression, as well as color Doppler and duplex ultrasound were performed to evaluate the lower extremity deep venous systems from the level of the common femoral vein and including the common femoral, femoral, profunda femoral, popliteal and calf veins including the posterior tibial, peroneal and gastrocnemius veins when visible. The superficial great saphenous vein was also interrogated. Spectral Doppler was utilized to evaluate flow at rest and with distal augmentation maneuvers in the common femoral, femoral and popliteal veins. COMPARISON:  Chest CTA - 10/04/2016 FINDINGS: Contralateral Common Femoral Vein: Respiratory phasicity is normal and symmetric with the symptomatic side. No evidence of thrombus. Normal compressibility. Common Femoral Vein: No evidence of thrombus. Normal compressibility, respiratory phasicity and response to augmentation. Saphenofemoral Junction: No evidence of thrombus. Normal compressibility and flow on color Doppler imaging. Profunda Femoral Vein: No evidence of thrombus. Normal compressibility and flow on color Doppler imaging. Femoral Vein: No evidence of thrombus. Normal compressibility, respiratory phasicity and response to augmentation. Popliteal Vein: There is short-segment hypoechoic near occlusive thrombus within the left  popliteal vein (images 37 through 43). Calf Veins: No evidence of thrombus. Normal compressibility and flow on color Doppler imaging. Superficial Great Saphenous Vein: No evidence of thrombus. Normal compressibility. Other Findings:  None. IMPRESSION: Examination is positive for short segment near occlusive DVT within the left popliteal vein. Electronically Signed   By: Simonne ComeJohn  Watts M.D.   On: 11/26/2016 13:43    Procedures Procedures (including critical care time)  Medications Ordered in ED Medications - No data to display   Initial Impression / Assessment and Plan / ED Course  I have reviewed the triage vital signs and the nursing notes.  Pertinent labs & imaging results that were available during my care of the patient were reviewed by me and considered in my medical decision making (see chart for details).  Clinical Course as of Nov 26 1444  Wed Nov 26, 2016  1445 The case was discussed with Dr. Arbutus Leasat, hospitalist.  He is comfortable treating this patient, with continued Eliquis therapy, and encourage compliance.  He does not consider her finding of a DVT at this time to be a treatment failure of Eliquis.  [  EW]    Clinical Course User Index [EW] Mancel BaleWentz, Annayah Worthley, MD     Patient Vitals for the past 24 hrs:  BP Temp Temp src Pulse Resp SpO2  11/26/16 1104 (!) 140/97 98.4 F (36.9 C) Oral 88 18 100 %    2:46 PM Reevaluation with update and discussion. After initial assessment and treatment, an updated evaluation reveals no change in clinical status.  Discussed with the patient and family members all questions were answered. Cristina Munoz      Final Clinical Impressions(s) / ED Diagnoses   Final diagnoses:  Lower leg DVT (deep venous thromboembolism), acute, left (HCC)   DVT left leg which is likely the source of her recent PE.  Patient likely had progression of a preceding DVT, related to her recent inadvertent cessation of Eliquis.  She is now on Eliquis for 1 week,  appropriately taking the medication, and is therefore unlikely to have progression of this DVT.  It is unlikely that this represents a treatment failure of Eliquis.  Doubt new or extending PE at this time.  Nursing Notes Reviewed/ Care Coordinated Applicable Imaging Reviewed Interpretation of Laboratory Data incorporated into ED treatment  The patient appears reasonably screened and/or stabilized for discharge and I doubt any other medical condition or other [facility] requiring further screening, evaluation, or treatment in the ED at this time prior to discharge.  Plan: Home Medications-continue current medications; Home Treatments-elevate left leg and use heat on it to improve symptoms; return here if the recommended treatment, does not improve the symptoms; Recommended follow up-PCP follow-up 1 week for checkup.   ED Discharge Orders    None       Mancel BaleWentz, Olivene [place], MD 11/26/16 1447

## 2016-11-26 NOTE — ED Triage Notes (Signed)
Pt c/o left calf pain x 2 days. Pain worse with movment. Had PE 1 month ago. Was out of her blood thinner med for one week last week. Nad. No warmth noted to calf. Homans sign positive

## 2016-11-26 NOTE — Discharge Instructions (Signed)
The imaging of the left leg showed a popliteal DVT.  You are already on Eliquis which should help this get better.  Continue taking the Eliquis until your doctor tells you to stop taking it.  Also to help the pain and swelling, elevate your left leg without putting pressure behind the left knee, as much as possible when you are not up and walking.  Return here, if needed, for problems.

## 2016-12-08 ENCOUNTER — Encounter: Payer: Self-pay | Admitting: Cardiovascular Disease

## 2016-12-08 ENCOUNTER — Ambulatory Visit (INDEPENDENT_AMBULATORY_CARE_PROVIDER_SITE_OTHER): Payer: Medicare Other | Admitting: Cardiovascular Disease

## 2016-12-08 VITALS — BP 136/94 | HR 86 | Ht 66.0 in | Wt 232.0 lb

## 2016-12-08 DIAGNOSIS — R0781 Pleurodynia: Secondary | ICD-10-CM

## 2016-12-08 DIAGNOSIS — I10 Essential (primary) hypertension: Secondary | ICD-10-CM

## 2016-12-08 DIAGNOSIS — I82432 Acute embolism and thrombosis of left popliteal vein: Secondary | ICD-10-CM | POA: Diagnosis not present

## 2016-12-08 DIAGNOSIS — I2699 Other pulmonary embolism without acute cor pulmonale: Secondary | ICD-10-CM

## 2016-12-08 DIAGNOSIS — R002 Palpitations: Secondary | ICD-10-CM

## 2016-12-08 NOTE — Progress Notes (Signed)
CARDIOLOGY CONSULT NOTE  Patient ID: Cristina Munoz MRN: [id number] DOB/AGE: 1950-06-03 66 y.o.  Admit date: (Not on file) Primary Physician: Aliene BeamsHagler, Rachel, MD Referring Physician: Aliene BeamsHagler, Rachel, MD  Reason for Consultation: Palpitations  HPI: Cristina Munoz is a 66 y.o. female who is being seen today for the evaluation of palpitations at the request of Aliene BeamsHagler, Rachel, MD.   She has a history of hypertension, elevated blood sugar, anemia, hyperlipidemia, chronic bilateral knee pain, and unprovoked pulmonary embolism (left-sided involving segmental and subsegmental vessels by CT angiogram on 10/04/16) for which she takes Eliquis.  She had missed doses of Eliquis for about a week and developed a left sided near occlusive DVT within the left popliteal vein diagnosed on 11/26/16.  She said her heart rate will speed up and skip a beta. She denies associated chest pain, shortness of breath, diaphoresis, and lightheadedness. She has intermittent shortness of breath related to bronchial asthma.  She occasionally drinks a cup of coffee. She occasionally drinks ginger ale. She now drinks mostly water.  Lupus anticoagulant, prothrombin gene mutation, and Factor 5 leiden were all normal.  Echocardiogram 10/05/16: Normal left ventricular systolic and diastolic function with normal regional wall motion, LVEF 65%, mild tricuspid regurgitation, pulmonary pressures 35 mmHg.  TSH ordered by PCP is pending.  Palpitations began in September. She experiences them after doing household chores such as vacuuming, washing dishes, and folding laundry. They may last several seconds.  Nuclear bone scan was negative for neoplastic disease on 10/06/16.  She also has left-sided chest pain in the inframammary region when taking deep breaths.  She has left calf and knee pain and has difficulty walking.  ECG performed on 10/06/16 which I personally interpreted demonstrates sinus rhythm with LVH.  Family  history: Brother had pulmonary embolism. One son who was murdered 2 years ago had a pulmonary embolism. Another son has DVT.    Allergies  Allergen Reactions  . Fish Allergy Other (See Comments)    Mouth and lips swell  . Tomato Swelling    Current Outpatient Medications  Medication Sig Dispense Refill  . albuterol (PROVENTIL HFA;VENTOLIN HFA) 108 (90 Base) MCG/ACT inhaler Inhale 2 puffs into the lungs every 6 (six) hours as needed for wheezing or shortness of breath. 1 Inhaler 0  . apixaban (ELIQUIS) 5 MG TABS tablet Take 1 tablet (5 mg total) by mouth 2 (two) times daily. 60 tablet 2  . diphenhydrAMINE (BENADRYL) 25 mg capsule Take 25 mg by mouth every 6 (six) hours as needed for allergies.    . hydrochlorothiazide (HYDRODIURIL) 25 MG tablet Take 1 tablet (25 mg total) by mouth daily. 90 tablet 3   No current facility-administered medications for this visit.     Past Medical History:  Diagnosis Date  . Arthritis   . Asthma   . DVT (deep venous thrombosis) (HCC)   . Hypertension   . Pulmonary embolism [facility](HCC)     Past Surgical History:  Procedure Laterality Date  . TUBAL LIGATION      Social History   Socioeconomic History  . Marital status: Widowed    Spouse name: Not on file  . Number of children: Not on file  . Years of education: Not on file  . Highest education level: Not on file  Social Needs  . Financial resource strain: Not on file  . Food insecurity - worry: Not on file  . Food insecurity - inability: Not on file  . Transportation  needs - medical: Not on file  . Transportation needs - non-medical: Not on file  Occupational History  . Occupation: retired  Tobacco Use  . Smoking status: Never Smoker  . Smokeless tobacco: Never Used  Substance and Sexual Activity  . Alcohol use: No  . Drug use: No  . Sexual activity: No  Other Topics Concern  . Not on file  Social History Narrative   Widowed. Has a living son and daughter. Does not smoke. Attends  church. Has grandchildren, six.       Current Meds  Medication Sig  . albuterol (PROVENTIL HFA;VENTOLIN HFA) 108 (90 Base) MCG/ACT inhaler Inhale 2 puffs into the lungs every 6 (six) hours as needed for wheezing or shortness of breath.  Marland Kitchen. apixaban (ELIQUIS) 5 MG TABS tablet Take 1 tablet (5 mg total) by mouth 2 (two) times daily.  . diphenhydrAMINE (BENADRYL) 25 mg capsule Take 25 mg by mouth every 6 (six) hours as needed for allergies.  . hydrochlorothiazide (HYDRODIURIL) 25 MG tablet Take 1 tablet (25 mg total) by mouth daily.      Review of systems complete and found to be negative unless listed above in HPI    Physical exam Blood pressure (!) 136/94, pulse 86, height 5\' 6"  (1.676 m), weight 232 lb (105.2 kg), SpO2 96 %. General: NAD Neck: No JVD, no thyromegaly or thyroid nodule.  Lungs: Clear to auscultation bilaterally with normal respiratory effort. CV: Nondisplaced PMI. Regular rate and rhythm, normal S1/S2, no S3/S4, no murmur.  Left leg is mildly swollen.  No carotid bruit.    Abdomen: Soft, nontender, no distention.  Skin: Intact without lesions or rashes.  Neurologic: Alert and oriented x 3.  Psych: Normal affect. Extremities: No clubbing or cyanosis.  HEENT: Normal.   ECG: Most recent ECG reviewed.   Labs: Lab Results  Component Value Date/Time   K 3.8 10/07/2016 05:51 AM   BUN 8 10/07/2016 05:51 AM   CREATININE 0.66 10/07/2016 05:51 AM   ALT 9 (L) 10/06/2016 01:42 PM   HGB 10.8 (L) 10/07/2016 05:51 AM     Lipids: No results found for: LDLCALC, LDLDIRECT, CHOL, TRIG, HDL      ASSESSMENT AND PLAN:  1.  Palpitations: She may be experiencing PACs or PVCs since pulmonary embolism which is not uncommon.  TSH is pending.  She does not drink excessive amounts of caffeine.  Echocardiogram was unremarkable.  I will obtain a 1 week event monitor.  2.  Left-sided pulmonary embolism which appear to be unprovoked as well as left popliteal DVT: Currently on Eliquis.   There is a strong family history of clotting disorders.  She should remain on Eliquis lifelong.  3.  Essential hypertension: Elevated diastolic blood pressure.  Recently started on hydrochlorothiazide 25 mg.  If palpitations are bothersome, I may consider the addition of a beta-blocker.  4.  Left-sided chest pain: This is pleuritic in etiology and related to her pulmonary embolism.  I explained this to her.   Disposition: Follow up in 6 weeks.   Signed: Prentice DockerSuresh Makina Skow, M.D., F.A.C.C.  12/08/2016, 9:25 AM

## 2016-12-08 NOTE — Patient Instructions (Signed)
Your physician wants you to follow-up in: 6 weeks with Dr.Koneswaran You will receive a reminder letter in the mail two months in advance. If you don't receive a letter, please call our office to schedule the follow-up appointment.   Your physician has recommended that you wear an event monitor. Event monitors are medical devices that record the heart's electrical activity. Doctors most often us these monitors to diagnose arrhythmias. Arrhythmias are problems with the speed or rhythm of the heartbeat. The monitor is a small, portable device. You can wear one while you do your normal daily activities. This is usually used to diagnose what is causing palpitations/syncope (passing out).  Your physician recommends that you continue on your current medications as directed. Please refer to the Current Medication list given to you today.    If you need a refill on your cardiac medications before your next appointment, please call your pharmacy.          No lab work ordered today.        Thank you for choosing [facility] !

## 2016-12-17 ENCOUNTER — Ambulatory Visit: Payer: Medicare Other | Admitting: Family Medicine

## 2016-12-18 ENCOUNTER — Telehealth: Payer: Self-pay | Admitting: Family Medicine

## 2016-12-18 DIAGNOSIS — I2699 Other pulmonary embolism without acute cor pulmonale: Secondary | ICD-10-CM

## 2016-12-18 MED ORDER — APIXABAN 5 MG PO TABS: 5.0000 mg | ORAL_TABLET | Freq: Two times a day (BID) | ORAL | 0 refills | Status: DC

## 2016-12-18 NOTE — Telephone Encounter (Signed)
PATIENT NEEDS REFILL OF ELIQUIS 5MG   [facility]

## 2016-12-19 ENCOUNTER — Other Ambulatory Visit: Payer: Self-pay

## 2016-12-19 ENCOUNTER — Emergency Department (HOSPITAL_COMMUNITY): Payer: Medicare Other

## 2016-12-19 ENCOUNTER — Encounter (HOSPITAL_COMMUNITY): Payer: Self-pay | Admitting: Emergency Medicine

## 2016-12-19 ENCOUNTER — Emergency Department (HOSPITAL_COMMUNITY)
Admission: EM | Admit: 2016-12-19 | Discharge: 2016-12-19 | Disposition: A | Discharge status: Home / Self Care | Payer: Medicare Other | Attending: Emergency Medicine | Admitting: Emergency Medicine

## 2016-12-19 DIAGNOSIS — Z79899 Other long term (current) drug therapy: Secondary | ICD-10-CM | POA: Insufficient documentation

## 2016-12-19 DIAGNOSIS — M546 Pain in thoracic spine: Secondary | ICD-10-CM

## 2016-12-19 DIAGNOSIS — J45909 Unspecified asthma, uncomplicated: Secondary | ICD-10-CM | POA: Insufficient documentation

## 2016-12-19 DIAGNOSIS — R0602 Shortness of breath: Secondary | ICD-10-CM | POA: Diagnosis not present

## 2016-12-19 DIAGNOSIS — I10 Essential (primary) hypertension: Secondary | ICD-10-CM | POA: Insufficient documentation

## 2016-12-19 DIAGNOSIS — R079 Chest pain, unspecified: Secondary | ICD-10-CM | POA: Diagnosis not present

## 2016-12-19 HISTORY — DX: Pain in unspecified knee: M25.569

## 2016-12-19 HISTORY — DX: Other chronic pain: G89.29

## 2016-12-19 LAB — CBC WITH DIFFERENTIAL/PLATELET
Basophils Absolute: 0 10*3/uL (ref 0.0–0.1)
Basophils Relative: 0 %
EOS ABS: 0.1 10*3/uL (ref 0.0–0.7)
EOS PCT: 2 %
HCT: 36 % (ref 36.0–46.0)
Hemoglobin: 11.4 g/dL — ABNORMAL LOW (ref 12.0–15.0)
LYMPHS ABS: 2.9 10*3/uL (ref 0.7–4.0)
LYMPHS PCT: 48 %
MCH: 27.7 pg (ref 26.0–34.0)
MCHC: 31.7 g/dL (ref 30.0–36.0)
MCV: 87.6 fL (ref 78.0–100.0)
MONO ABS: 0.3 10*3/uL (ref 0.1–1.0)
MONOS PCT: 6 %
Neutro Abs: 2.6 10*3/uL (ref 1.7–7.7)
Neutrophils Relative %: 44 %
PLATELETS: 237 10*3/uL (ref 150–400)
RBC: 4.11 MIL/uL (ref 3.87–5.11)
RDW: 15 % (ref 11.5–15.5)
WBC: 6 10*3/uL (ref 4.0–10.5)

## 2016-12-19 LAB — TROPONIN I: Troponin I: <0.03 ng/mL (ref <0.03)

## 2016-12-19 LAB — COMPREHENSIVE METABOLIC PANEL
ALK PHOS: 99 U/L (ref 38–126)
ALT: 11 U/L — AB (ref 14–54)
AST: 15 U/L (ref 15–41)
Albumin: 4 g/dL (ref 3.5–5.0)
Anion gap: 8 (ref 5–15)
BUN: 26 mg/dL — AB (ref 6–20)
CALCIUM: 9.2 mg/dL (ref 8.9–10.3)
CHLORIDE: 102 mmol/L (ref 101–111)
CO2: 26 mmol/L (ref 22–32)
CREATININE: 0.95 mg/dL (ref 0.44–1.00)
GFR calc non Af Amer: >60 mL/min (ref >60)
GLUCOSE: 102 mg/dL — AB (ref 65–99)
Potassium: 4.1 mmol/L (ref 3.5–5.1)
SODIUM: 136 mmol/L (ref 135–145)
Total Bilirubin: 0.6 mg/dL (ref 0.3–1.2)
Total Protein: 7.7 g/dL (ref 6.5–8.1)

## 2016-12-19 MED ORDER — IOPAMIDOL (ISOVUE-370) INJECTION 76%
100.0000 mL | Freq: Once | INTRAVENOUS | Status: AC | PRN
  Administered 2016-12-19: 100 mL via INTRAVENOUS

## 2016-12-19 MED ORDER — MORPHINE SULFATE (PF) 4 MG/ML IV SOLN: 4.0000 mg | INTRAVENOUS | Status: DC | PRN

## 2016-12-19 MED ORDER — HYDROCODONE-ACETAMINOPHEN 5-325 MG PO TABS: ORAL_TABLET | ORAL | 0 refills | Status: DC

## 2016-12-19 NOTE — ED Triage Notes (Signed)
Patient c/o right upper back pain that started today. Per patient worse with movement. Patient recently diagnosed with PEs and states pain is similar. Patient does report some shortness of breath. Denies any pain in chest. Patient currently taking eliqus. Patient also states she is having to take benadryl daily due to reacquiring allergic reactions.

## 2016-12-19 NOTE — Discharge Instructions (Signed)
Take the prescription as directed. Apply moist heat or ice to the area(s) of discomfort, for 15 minutes at a time, several times per day for the next few days.  Do not fall asleep on a heating or ice pack.  Your CT scan showed an incidental finding: "Right renal parapelvic cyst."  Your regular medical doctor can follow up this finding as needed. Call your regular medical doctor on Monday to schedule a follow up appointment within the next 3 days.  Return to the Emergency Department immediately sooner if worsening.

## 2016-12-19 NOTE — ED Provider Notes (Signed)
[facility] EMERGENCY DEPARTMENT Provider Note   CSN: [id number] Arrival date & time: 12/19/16  1450     History   Chief Complaint Chief Complaint  Patient presents with  . Back Pain    HPI Cristina Munoz is a 66 y.o. female.   Back Pain      Pt was seen at 1535. Per pt, c/o gradual onset and persistence of constant right sided thoracic back "pain" that began several hours ago. Pain worsens with palpation and movement of her torso. Pt states she developed the pain after "stopping for gas." Has been associated with mild SOB. Pt states she is concerned regarding "a blood clot" in her lungs. Pt states 4 weeks ago she ran out of Eliquis. States she did not take it for 1 week, and was dx with DVT LLE. Pt has since restarted her Eliquis, and has been taking it regularly. Denies CP/palpitations, no cough, no abd pain, no N/V/D, no fevers, no rash, no injury, no focal motor weakness, no tingling/numbness in extremities.   Past Medical History:  Diagnosis Date  . Arthritis   . Asthma   . Chronic knee pain   . DVT (deep venous thrombosis) (HCC)   . Hypertension   . Pulmonary embolism [facility](HCC)     Patient Active Problem List   Diagnosis Date Noted  . Elevated BP without diagnosis of hypertension 11/19/2016  . Elevated blood sugar 11/19/2016  . Anaphylaxis 11/19/2016  . Syncope   . Pleuritic chest pain 10/05/2016  . Acute pulmonary embolus (HCC) 10/04/2016  . LOWER LEG, ARTHRITIS, DEGEN./OSTEO 03/30/2007  . KNEE PAIN 03/30/2007  . DISC DEGENERATION 03/30/2007  . BACK PAIN 03/30/2007    Past Surgical History:  Procedure Laterality Date  . TUBAL LIGATION      OB History    No data available       Home Medications    Prior to Admission medications   Medication Sig Start Date End Date Taking? Authorizing Provider  albuterol (PROVENTIL HFA;VENTOLIN HFA) 108 (90 Base) MCG/ACT inhaler Inhale 2 puffs into the lungs every 6 (six) hours as needed for wheezing or shortness of  breath. 10/22/16   Aliene BeamsHagler, Rachel, MD  apixaban (ELIQUIS) 5 MG TABS tablet Take 1 tablet (5 mg total) by mouth 2 (two) times daily. 12/18/16   Aliene BeamsHagler, Rachel, MD  diphenhydrAMINE (BENADRYL) 25 mg capsule Take 25 mg by mouth every 6 (six) hours as needed for allergies.    [provider]  hydrochlorothiazide (HYDRODIURIL) 25 MG tablet Take 1 tablet (25 mg total) by mouth daily. 11/19/16   Aliene BeamsHagler, Rachel, MD    Family History Family History  Problem Relation Age of Onset  . Hypertension Mother   . Diabetes Mother   . Dementia Mother     Social History Social History   Tobacco Use  . Smoking status: Never Smoker  . Smokeless tobacco: Never Used  Substance Use Topics  . Alcohol use: No  . Drug use: No     Allergies   Fish allergy and Tomato   Review of Systems Review of Systems  Musculoskeletal: Positive for back pain.  ROS: Statement: All systems negative except as marked or noted in the HPI; Constitutional: Negative for fever and chills. ; ; Eyes: Negative for eye pain, redness and discharge. ; ; ENMT: Negative for ear pain, hoarseness, nasal congestion, sinus pressure and sore throat. ; ; Cardiovascular: Negative for chest pain, palpitations, diaphoresis, and peripheral edema. ; ; Respiratory: +mild SOB. Negative  for cough, wheezing and stridor. ; ; Gastrointestinal: Negative for nausea, vomiting, diarrhea, abdominal pain, blood in stool, hematemesis, jaundice and rectal bleeding. . ; ; Genitourinary: Negative for dysuria, flank pain and hematuria. ; ; Musculoskeletal: +back pain. Negative for neck pain. Negative for swelling and trauma.; ; Skin: Negative for pruritus, rash, abrasions, blisters, bruising and skin lesion.; ; Neuro: Negative for headache, lightheadedness and neck stiffness. Negative for weakness, altered level of consciousness, altered mental status, extremity weakness, paresthesias, involuntary movement, seizure and syncope.      Physical Exam Updated  Vital Signs BP (!) 154/89 (BP Location: Right Arm)   Pulse 82   Temp 97.7 F (36.5 C) (Oral)   Resp 18   Ht 5\' 6"  (1.676 m)   Wt 105.2 kg (232 lb)   SpO2 100%   BMI 37.45 kg/m   Physical Exam 1540: Physical examination:  Nursing notes reviewed; Vital signs and O2 SAT reviewed;  Constitutional: Well developed, Well nourished, Well hydrated, In no acute distress; Head:  Normocephalic, atraumatic; Eyes: EOMI, PERRL, No scleral icterus; ENMT: Mouth and pharynx normal, Mucous membranes moist; Neck: Supple, Full range of motion, No lymphadenopathy; Cardiovascular: Regular rate and rhythm, No gallop; Respiratory: Breath sounds clear & equal bilaterally, No wheezes.  Speaking full sentences with ease, Normal respiratory effort/excursion; Chest: Nontender, Movement normal; Abdomen: Soft, Nontender, Nondistended, Normal bowel sounds; Genitourinary: No CVA tenderness; Spine:  No midline CS, TS, LS tenderness. +TTP right thoracic paraspinal muscles.;; Extremities: Pulses normal, No tenderness, +2 pedal edema bilat. No calf asymmetry.; Neuro: AA&Ox3, Major CN grossly intact.  Speech clear. No gross focal motor or sensory deficits in extremities.; Skin: Color normal, Warm, Dry.   ED Treatments / Results  Labs (all labs ordered are listed, but only abnormal results are displayed)   EKG  EKG Interpretation  Date/Time:  Friday December 19 2016 15:59:11 EST Ventricular Rate:  67 PR Interval:    QRS Duration: 102 QT Interval:  408 QTC Calculation: 431 R Axis:   -4 Text Interpretation:  Sinus rhythm Low voltage, precordial leads Baseline wander When compared with ECG of 10/06/2016 No significant change was found Confirmed by Samuel Jester [phone]) on 12/19/2016 4:04:12 PM       Radiology   Procedures Procedures (including critical care time)  Medications Ordered in ED Medications - No data to display   Initial Impression / Assessment and Plan / ED Course  I have reviewed the triage  vital signs and the nursing notes.  Pertinent labs & imaging results that were available during my care of the patient were reviewed by me and considered in my medical decision making (see chart for details).  MDM Reviewed: previous chart, nursing note and vitals Reviewed previous: labs and ECG Interpretation: labs, ECG and CT scan   Results for orders placed or performed during the hospital encounter of 12/19/16  Comprehensive metabolic panel  Result Value Ref Range   Sodium 136 135 - 145 mmol/L   Potassium 4.1 3.5 - 5.1 mmol/L   Chloride 102 101 - 111 mmol/L   CO2 26 22 - 32 mmol/L   Glucose, Bld 102 (H) 65 - 99 mg/dL   BUN 26 (H) 6 - 20 mg/dL   Creatinine, Ser 9.62 0.44 - 1.00 mg/dL   Calcium 9.2 8.9 - 95.2 mg/dL   Total Protein 7.7 6.5 - 8.1 g/dL   Albumin 4.0 3.5 - 5.0 g/dL   AST 15 15 - 41 U/L   ALT 11 (L) 14 - 54  U/L   Alkaline Phosphatase 99 38 - 126 U/L   Total Bilirubin 0.6 0.3 - 1.2 mg/dL   GFR calc non Af Amer >60 >60 mL/min   GFR calc Af Amer >60 >60 mL/min   Anion gap 8 5 - 15  Troponin I  Result Value Ref Range   Troponin I <0.03 <0.03 ng/mL  CBC with Differential  Result Value Ref Range   WBC 6.0 4.0 - 10.5 K/uL   RBC 4.11 3.87 - 5.11 MIL/uL   Hemoglobin 11.4 (L) 12.0 - 15.0 g/dL   HCT 16.136.0 09.636.0 - 04.546.0 %   MCV 87.6 78.0 - 100.0 fL   MCH 27.7 26.0 - 34.0 pg   MCHC 31.7 30.0 - 36.0 g/dL   RDW 40.915.0 81.111.5 - 91.415.5 %   Platelets 237 150 - 400 K/uL   Neutrophils Relative % 44 %   Neutro Abs 2.6 1.7 - 7.7 K/uL   Lymphocytes Relative 48 %   Lymphs Abs 2.9 0.7 - 4.0 K/uL   Monocytes Relative 6 %   Monocytes Absolute 0.3 0.1 - 1.0 K/uL   Eosinophils Relative 2 %   Eosinophils Absolute 0.1 0.0 - 0.7 K/uL   Basophils Relative 0 %   Basophils Absolute 0.0 0.0 - 0.1 K/uL  Troponin I  Result Value Ref Range   Troponin I <0.03 <0.03 ng/mL   Ct Angio Chest Pe W/cm &/or Wo Cm Result Date: 12/19/2016 CLINICAL DATA:  Right upper back pain today, worse with  movement. Recently diagnosed with pulmonary emboli. The pain is similar. Some shortness of breath. Currently taking Eliquis. EXAM: CT ANGIOGRAPHY CHEST WITH CONTRAST TECHNIQUE: Multidetector CT imaging of the chest was performed using the standard protocol during bolus administration of intravenous contrast. Multiplanar CT image reconstructions and MIPs were obtained to evaluate the vascular anatomy. CONTRAST:  100mL ISOVUE-370 IOPAMIDOL (ISOVUE-370) INJECTION 76% COMPARISON:  10/04/2016. FINDINGS: Cardiovascular: Normally opacified pulmonary arteries with no pulmonary arterial filling defects. The previously seen pulmonary emboli have resolved. Minimal aortic arch atheromatous calcification. Mediastinum/Nodes: No enlarged mediastinal, hilar, or axillary lymph nodes. Thyroid gland, trachea, and esophagus demonstrate no significant findings. Lungs/Pleura: Lungs are clear. No pleural effusion or pneumothorax. Upper Abdomen: Right renal parapelvic cyst. Musculoskeletal: Thoracic spine degenerative changes. Review of the MIP images confirms the above findings. IMPRESSION: 1. Resolved pulmonary emboli with no new emboli seen. 2. No acute abnormality. 3. Minimal calcific aortic atherosclerosis. Aortic Atherosclerosis (ICD10-I70.0). Electronically Signed   By: Beckie SaltsSteven  Reid M.D.   On: 12/19/2016 17:16    1845:  Feels better after pain meds and wants to go home now. Has been ambulatory with steady gait, easy resps, NAD. Doubt TAA, dissection, PE as cause for symptoms with negative CT-A chest.  Doubt ACS as cause for symptoms with normal troponin x2 and unchanged EKG from previous with very atypical symptoms (no CP). Tx symptomatically at this time. Dx and testing d/w pt.  Questions answered.  Verb understanding, agreeable to d/c home with outpt f/u.     Final Clinical Impressions(s) / ED Diagnoses   Final diagnoses:  None    ED Discharge Orders    None        Samuel JesterMcManus, Avarey Yaeger, DO 12/22/16 2351

## 2016-12-19 NOTE — ED Notes (Signed)
EMT Students drawing bloodwork

## 2016-12-19 NOTE — ED Notes (Signed)
Pt to CT

## 2016-12-23 ENCOUNTER — Ambulatory Visit (INDEPENDENT_AMBULATORY_CARE_PROVIDER_SITE_OTHER): Payer: Medicare Other | Admitting: Allergy & Immunology

## 2016-12-23 ENCOUNTER — Encounter: Payer: Self-pay | Admitting: Allergy & Immunology

## 2016-12-23 VITALS — BP 130/80 | HR 90 | Temp 98.0°F | Resp 17 | Ht 66.54 in | Wt 235.4 lb

## 2016-12-23 DIAGNOSIS — J310 Chronic rhinitis: Secondary | ICD-10-CM | POA: Insufficient documentation

## 2016-12-23 DIAGNOSIS — T783XXD Angioneurotic edema, subsequent encounter: Secondary | ICD-10-CM

## 2016-12-23 DIAGNOSIS — T7800XD Anaphylactic reaction due to unspecified food, subsequent encounter: Secondary | ICD-10-CM

## 2016-12-23 DIAGNOSIS — J4520 Mild intermittent asthma, uncomplicated: Secondary | ICD-10-CM | POA: Insufficient documentation

## 2016-12-23 DIAGNOSIS — T783XXA Angioneurotic edema, initial encounter: Secondary | ICD-10-CM | POA: Insufficient documentation

## 2016-12-23 MED ORDER — EPINEPHRINE 0.3 MG/0.3ML IJ SOAJ: 0.3000 mg | Freq: Once | INTRAMUSCULAR | 2 refills | Status: AC

## 2016-12-23 MED ORDER — CETIRIZINE HCL 10 MG PO TABS: 10.0000 mg | ORAL_TABLET | Freq: Every day | ORAL | 5 refills | Status: AC

## 2016-12-23 MED ORDER — FLUTICASONE PROPIONATE 50 MCG/ACT NA SUSP: 1.0000 | Freq: Every day | NASAL | 5 refills | Status: DC

## 2016-12-23 NOTE — Progress Notes (Signed)
NEW PATIENT  Date of Service/Encounter:  12/23/16  Referring provider: Aliene BeamsHagler, Rachel, MD   Assessment:   Chronic rhinitis - with negative testing today  Angioedema - triggered by seafood and tomato (with negative skin testing today)  Mild intermittent asthma without complication   Plan/Recommendations:   1. Angioedema - Testing was negative to the seafood and tomato. - We will get some lab work to confirm this. - In the meantime, be sure to get your EpiPen.  - Take note of any other food triggers for the swelling episodes. - Start taking Zyrtec (cetirizine) 10mg  once daily.  - We will send this in, but it is available over the counter for very cheap.   2. Chronic rhinitis - Testing today was negative to the entire panel - Start taking: Flonase (fluticasone) one spray per nostril daily - Start nasal saline rinses 1-2 times daily to remove allergens from the nasal cavities as well as help with mucous clearance (this is especially helpful to do before the nasal sprays are given) - We will get some blood work to make sure that you do not have any environmental allergies.  3. Intermittent asthma - Lung function looked great today. - Continue with albuterol 2-4 puffs every 4-6 hours as needed. - There is no need for a daily controller medication at this time.   4. Return in about 3 months (around 03/23/2017).   Subjective:   Cristina Munoz is a 66 y.o. female presenting today for evaluation of  Chief Complaint  Patient presents with  . Allergic Reaction    Cristina Munoz has a history of the following: Patient Active Problem List   Diagnosis Date Noted  . Mild intermittent asthma without complication 12/23/2016  . Chronic rhinitis 12/23/2016  . Angio-edema 12/23/2016  . Elevated BP without diagnosis of hypertension 11/19/2016  . Elevated blood sugar 11/19/2016  . Anaphylaxis 11/19/2016  . Syncope   . Pleuritic chest pain 10/05/2016  . Acute pulmonary embolus  (HCC) 10/04/2016  . LOWER LEG, ARTHRITIS, DEGEN./OSTEO 03/30/2007  . KNEE PAIN 03/30/2007  . DISC DEGENERATION 03/30/2007  . BACK PAIN 03/30/2007    History obtained from: chart review and patient, who is a poor historian.  Cristina MireArmanda J Greer was referred by Aliene BeamsHagler, Rachel, MD.     Cristina Munoz is a 66 y.o. female presenting for evaluation. She is here today for an evaluation of intermittent angioedema episodes of her face. This has been ongoing for a number of years; review of the EMR shows multiple ED visits for this, dating back to at least September 2016. It seems that these are always treated as an allergic reaction with steroids and antihistamines, with quick improvement of symptoms. She has never had allergy testing performed. At each of these episodes, she does report pruritis, but interestingly she denies ever having had any urticaria whatsoever.   Tomato sauce - develops swelling of her face and mouth. She has never been tested. This happened with pizza as well as a burger with ketchup. She avoids spaghetti.   Seafood - develops swelling of her face and mouth. She noticed this a while ago. She did get a prescription for an EpiPen and they are in the process of ordering it. She has been to the ED for this, last in September 2017.    She does endorse chronic sinus pressure, which worsens in the cold air. She is not treated routinely with antibiotics. She does not use a chronic antihistamine and does not use  a nasal spray at all. She does have a diagnosis of asthma and has Ventolin on hand. She was diagnosed as an adult. She denies coughing and wheezing. She estimates that she uses the Ventolin less than once per week. Her breathing problems tend to flare when she "does too much". She was on a reflux medication in the distant past, but she is on nothing now and her symptoms have not changed.   She has a history of blood clots, including PEs, and is currently on Eliquis. Otherwise, there is no  history of other atopic diseases, including drug allergies, stinging insect allergies, or urticaria. There is no significant infectious history. Vaccinations are up to date.    Past Medical History: Patient Active Problem List   Diagnosis Date Noted  . Mild intermittent asthma without complication 12/23/2016  . Chronic rhinitis 12/23/2016  . Angio-edema 12/23/2016  . Elevated BP without diagnosis of hypertension 11/19/2016  . Elevated blood sugar 11/19/2016  . Anaphylaxis 11/19/2016  . Syncope   . Pleuritic chest pain 10/05/2016  . Acute pulmonary embolus (HCC) 10/04/2016  . LOWER LEG, ARTHRITIS, DEGEN./OSTEO 03/30/2007  . KNEE PAIN 03/30/2007  . DISC DEGENERATION 03/30/2007  . BACK PAIN 03/30/2007    Medication List:  Allergies as of 12/23/2016      Reactions   Fish Allergy Other (See Comments)   Mouth and lips swell   Tomato Swelling      Medication List        Accurate as of 12/23/16 12:18 PM. Always use your most recent med list.          albuterol 108 (90 Base) MCG/ACT inhaler Commonly known as:  PROVENTIL HFA;VENTOLIN HFA Inhale 2 puffs into the lungs every 6 (six) hours as needed for wheezing or shortness of breath.   apixaban 5 MG Tabs tablet Commonly known as:  ELIQUIS Take 1 tablet (5 mg total) by mouth 2 (two) times daily.   cetirizine 10 MG tablet Commonly known as:  ZYRTEC Take 1 tablet (10 mg total) by mouth daily.   diphenhydrAMINE 25 mg capsule Commonly known as:  BENADRYL Take 25 mg by mouth daily.   EPINEPHrine 0.3 mg/0.3 mL Soaj injection Commonly known as:  EPIPEN 2-PAK Inject 0.3 mLs (0.3 mg total) into the muscle once for 1 dose.   fluticasone 50 MCG/ACT nasal spray Commonly known as:  FLONASE Place 1 spray into both nostrils daily.   hydrochlorothiazide 25 MG tablet Commonly known as:  HYDRODIURIL Take 1 tablet (25 mg total) by mouth daily.   HYDROcodone-acetaminophen 5-325 MG tablet Commonly known as:  NORCO/VICODIN 1 or 2 tabs  PO q8 hours prn pain       Birth History: non-contributory.   Developmental History: non-contributory.   Past Surgical History: Past Surgical History:  Procedure Laterality Date  . TUBAL LIGATION       Family History: Family History  Problem Relation Age of Onset  . Hypertension Mother   . Diabetes Mother   . Dementia Mother      Social History: [place] lives in an apartment. There is carpeting throughout the home. She has electric heating and central cooling. There are no animals inside or outside of the home. There are dogs in upstairs apartments, but not hers. There is no smoking exposure and no dust mite coverings on the bedding.      Review of Systems: a 14-point review of systems is pertinent for what is mentioned in HPI.  Otherwise, all other systems were  negative. Constitutional: negative other than that listed in the HPI Eyes: negative other than that listed in the HPI Ears, nose, mouth, throat, and face: negative other than that listed in the HPI Respiratory: negative other than that listed in the HPI Cardiovascular: negative other than that listed in the HPI Gastrointestinal: negative other than that listed in the HPI Genitourinary: negative other than that listed in the HPI Integument: negative other than that listed in the HPI Hematologic: negative other than that listed in the HPI Musculoskeletal: negative other than that listed in the HPI Neurological: negative other than that listed in the HPI Allergy/Immunologic: negative other than that listed in the HPI    Objective:   Blood pressure 130/80, pulse 90, temperature 98 F (36.7 C), resp. rate 17, height 5' 6.54" (1.69 m), weight 235 lb 6.4 oz (106.8 kg), SpO2 96 %. Body mass index is 37.39 kg/m.   Physical Exam:  General: Alert, interactive, in no acute distress. Talkative. Flight of ideas.  Eyes: No conjunctival injection bilaterally, no discharge on the right, no discharge on the left and no  Horner-Trantas dots present. PERRL bilaterally. EOMI without pain. No photophobia.  Ears: Right TM pearly gray with normal light reflex, Left TM pearly gray with normal light reflex, Right TM intact without perforation and Left TM intact without perforation.  Nose/Throat: External nose within normal limits and septum midline. Turbinates edematous and pale with clear discharge. Posterior oropharynx erythematous without cobblestoning in the posterior oropharynx. Tonsils 2+ without exudates.  Tongue without thrush. Neck: Supple without thyromegaly. Trachea midline. Adenopathy: shoddy bilateral anterior cervical lymphadenopathy and no enlarged lymph nodes appreciated in the occipital, axillary, epitrochlear, inguinal, or popliteal regions. Lungs: Clear to auscultation without wheezing, rhonchi or rales. No increased work of breathing. CV: Normal S1/S2. No murmurs. Capillary refill <2 seconds.  Abdomen: Nondistended, nontender. No guarding or rebound tenderness. Bowel sounds present in all fields and hypoactive  Skin: Warm and dry, without lesions or rashes. Extremities:  No clubbing, cyanosis or edema. Neuro:   Grossly intact. No focal deficits appreciated. Responsive to questions.  Diagnostic studies:   Spirometry: results normal (FEV1: 2.99/125%, FVC: 1.70/84%, FEV1/FVC: 56%).    Spirometry consistent with normal pattern. Read on the spirometry was moderate obstruction, but the decreased FEV1/FVC ratio is secondary to the elevated FVC. Higher than normal FVC are associated with obstructive disease and air trapping, but she is otherwise asymptomatic. Therefore we will defer further workup at this point.   Allergy Studies:  Indoor/Outdoor Percutaneous Adult Environmental Panel: negative to the entire panel with adequate controls.  Selected Food Panel: negative to Tomato, Flounder, Trout, Shrimp, Ben [place]rab, Hod[place]una, Gold RiverOyster, Bloom[place]obster, CougarScallop and [place]mon       Dwon Sky, MD [facility]

## 2016-12-23 NOTE — Patient Instructions (Addendum)
1. Angioedema - Testing was negative to the seafood and tomato. - We will get some lab work to confirm this. - In the meantime, be sure to get your EpiPen.  - Take note of any other food triggers for the swelling episodes. - Start taking Zyrtec (cetirizine) 10mg  once daily.  - We will send this in, but it is available over the counter for very cheap.   2. Chronic rhinitis - Testing today was negative to the entire panel - Start taking: Flonase (fluticasone) one spray per nostril daily - Start nasal saline rinses 1-2 times daily to remove allergens from the nasal cavities as well as help with mucous clearance (this is especially helpful to do before the nasal sprays are given) - We will get some blood work to make sure that you do not have any environmental allergies.  3. Intermittent asthma - Lung function looked great today. - Continue with albuterol 2-4 puffs every 4-6 hours as needed. - There is no need for a daily controller medication at this time.   4. Return in about 3 months (around 03/23/2017).    Please inform us of any Emergency Department visits, hospitalizations, or changes in symptoms. Call us before going to the ED for breathing or allergy symptoms since we might be able to fit you in for a sick visit. Feel free to contact us anytime with any questions, problems, or concerns.  It was a pleasure to meet you today! Enjoy the winter season!  Websites that have reliable patient information: 1. American Academy of Asthma, Allergy, and Immunology: www.aaaai.org 2. Food Allergy Research and Education (FARE): foodallergy.org 3. Mothers of Asthmatics: http://www.asthmacommunitynetwork.org 4. American College of Allergy, Asthma, and Immunology: www.acaai.org

## 2016-12-25 ENCOUNTER — Ambulatory Visit (INDEPENDENT_AMBULATORY_CARE_PROVIDER_SITE_OTHER): Payer: Medicare Other | Admitting: Family Medicine

## 2016-12-25 ENCOUNTER — Telehealth: Payer: Self-pay

## 2016-12-25 ENCOUNTER — Other Ambulatory Visit: Payer: Self-pay

## 2016-12-25 ENCOUNTER — Encounter: Payer: Self-pay | Admitting: Family Medicine

## 2016-12-25 ENCOUNTER — Other Ambulatory Visit: Payer: Self-pay | Admitting: Family Medicine

## 2016-12-25 VITALS — BP 130/80 | HR 96 | Temp 98.2°F | Resp 16 | Ht 66.0 in | Wt 238.2 lb

## 2016-12-25 DIAGNOSIS — I2699 Other pulmonary embolism without acute cor pulmonale: Secondary | ICD-10-CM

## 2016-12-25 DIAGNOSIS — Z1231 Encounter for screening mammogram for malignant neoplasm of breast: Secondary | ICD-10-CM

## 2016-12-25 DIAGNOSIS — Z23 Encounter for immunization: Secondary | ICD-10-CM

## 2016-12-25 DIAGNOSIS — R002 Palpitations: Secondary | ICD-10-CM | POA: Diagnosis not present

## 2016-12-25 DIAGNOSIS — G8929 Other chronic pain: Secondary | ICD-10-CM | POA: Diagnosis not present

## 2016-12-25 DIAGNOSIS — Z1159 Encounter for screening for other viral diseases: Secondary | ICD-10-CM | POA: Diagnosis not present

## 2016-12-25 DIAGNOSIS — I10 Essential (primary) hypertension: Secondary | ICD-10-CM

## 2016-12-25 DIAGNOSIS — M25561 Pain in right knee: Secondary | ICD-10-CM | POA: Diagnosis not present

## 2016-12-25 DIAGNOSIS — Z1239 Encounter for other screening for malignant neoplasm of breast: Secondary | ICD-10-CM

## 2016-12-25 DIAGNOSIS — M25562 Pain in left knee: Secondary | ICD-10-CM

## 2016-12-25 MED ORDER — APIXABAN 5 MG PO TABS: 5.0000 mg | ORAL_TABLET | Freq: Two times a day (BID) | ORAL | 3 refills | Status: DC

## 2016-12-25 NOTE — Patient Instructions (Signed)
DASH Eating Plan DASH stands for "Dietary Approaches to Stop Hypertension." The DASH eating plan is a healthy eating plan that has been shown to reduce high blood pressure (hypertension). It may also reduce your risk for type 2 diabetes, heart disease, and stroke. The DASH eating plan may also help with weight loss. What are tips for following this plan? General guidelines  Avoid eating more than 2,300 mg (milligrams) of salt (sodium) a day. If you have hypertension, you may need to reduce your sodium intake to 1,500 mg a day.  Limit alcohol intake to no more than 1 drink a day for nonpregnant women and 2 drinks a day for men. One drink equals 12 oz of beer, 5 oz of wine, or 1 oz of hard liquor.  Work with your health care provider to maintain a healthy body weight or to lose weight. Ask what an ideal weight is for you.  Get at least 30 minutes of exercise that causes your heart to beat faster (aerobic exercise) most days of the week. Activities may include walking, swimming, or biking.  Work with your health care provider or diet and nutrition specialist (dietitian) to adjust your eating plan to your individual calorie needs. Reading food labels  Check food labels for the amount of sodium per serving. Choose foods with less than 5 percent of the Daily Value of sodium. Generally, foods with less than 300 mg of sodium per serving fit into this eating plan.  To find whole grains, look for the word "whole" as the first word in the ingredient list. Shopping  Buy products labeled as "low-sodium" or "no salt added."  Buy fresh foods. Avoid canned foods and premade or frozen meals. Cooking  Avoid adding salt when cooking. Use salt-free seasonings or herbs instead of table salt or sea salt. Check with your health care provider or pharmacist before using salt substitutes.  Do not fry foods. Cook foods using healthy methods such as baking, boiling, grilling, and broiling instead.  Cook with  heart-healthy oils, such as olive, canola, soybean, or sunflower oil. Meal planning   Eat a balanced diet that includes: ? 5 or more servings of fruits and vegetables each day. At each meal, try to fill half of your plate with fruits and vegetables. ? Up to 6-8 servings of whole grains each day. ? Less than 6 oz of lean meat, poultry, or fish each day. A 3-oz serving of meat is about the same size as a deck of cards. One egg equals 1 oz. ? 2 servings of low-fat dairy each day. ? A serving of nuts, seeds, or beans 5 times each week. ? Heart-healthy fats. Healthy fats called Omega-3 fatty acids are found in foods such as flaxseeds and coldwater fish, like sardines, salmon, and mackerel.  Limit how much you eat of the following: ? Canned or prepackaged foods. ? Food that is high in trans fat, such as fried foods. ? Food that is high in saturated fat, such as fatty meat. ? Sweets, desserts, sugary drinks, and other foods with added sugar. ? Full-fat dairy products.  Do not salt foods before eating.  Try to eat at least 2 vegetarian meals each week.  Eat more home-cooked food and less restaurant, buffet, and fast food.  When eating at a restaurant, ask that your food be prepared with less salt or no salt, if possible. What foods are recommended? The items listed may not be a complete list. Talk with your dietitian about what   dietary choices are best for you. Grains Whole-grain or whole-wheat bread. Whole-grain or whole-wheat pasta. Brown rice. Oatmeal. Quinoa. Bulgur. Whole-grain and low-sodium cereals. Pita bread. Low-fat, low-sodium crackers. Whole-wheat flour tortillas. Vegetables Fresh or frozen vegetables (raw, steamed, roasted, or grilled). Low-sodium or reduced-sodium tomato and vegetable juice. Low-sodium or reduced-sodium tomato sauce and tomato paste. Low-sodium or reduced-sodium canned vegetables. Fruits All fresh, dried, or frozen fruit. Canned fruit in natural juice (without  added sugar). Meat and other protein foods Skinless chicken or turkey. Ground chicken or turkey. Pork with fat trimmed off. Fish and seafood. Egg whites. Dried beans, peas, or lentils. Unsalted nuts, nut butters, and seeds. Unsalted canned beans. Lean cuts of beef with fat trimmed off. Low-sodium, lean deli meat. Dairy Low-fat (1%) or fat-free (skim) milk. Fat-free, low-fat, or reduced-fat cheeses. Nonfat, low-sodium ricotta or cottage cheese. Low-fat or nonfat yogurt. Low-fat, low-sodium cheese. Fats and oils Soft margarine without trans fats. Vegetable oil. Low-fat, reduced-fat, or light mayonnaise and salad dressings (reduced-sodium). Canola, safflower, olive, soybean, and sunflower oils. Avocado. Seasoning and other foods Herbs. Spices. Seasoning mixes without salt. Unsalted popcorn and pretzels. Fat-free sweets. What foods are not recommended? The items listed may not be a complete list. Talk with your dietitian about what dietary choices are best for you. Grains Baked goods made with fat, such as croissants, muffins, or some breads. Dry pasta or rice meal packs. Vegetables Creamed or fried vegetables. Vegetables in a cheese sauce. Regular canned vegetables (not low-sodium or reduced-sodium). Regular canned tomato sauce and paste (not low-sodium or reduced-sodium). Regular tomato and vegetable juice (not low-sodium or reduced-sodium). Pickles. Olives. Fruits Canned fruit in a light or heavy syrup. Fried fruit. Fruit in cream or butter sauce. Meat and other protein foods Fatty cuts of meat. Ribs. Fried meat. Bacon. Sausage. Bologna and other processed lunch meats. Salami. Fatback. Hotdogs. Bratwurst. Salted nuts and seeds. Canned beans with added salt. Canned or smoked fish. Whole eggs or egg yolks. Chicken or turkey with skin. Dairy Whole or 2% milk, cream, and half-and-half. Whole or full-fat cream cheese. Whole-fat or sweetened yogurt. Full-fat cheese. Nondairy creamers. Whipped toppings.  Processed cheese and cheese spreads. Fats and oils Butter. Stick margarine. Lard. Shortening. Ghee. Bacon fat. Tropical oils, such as coconut, palm kernel, or palm oil. Seasoning and other foods Salted popcorn and pretzels. Onion salt, garlic salt, seasoned salt, table salt, and sea salt. Worcestershire sauce. Tartar sauce. Barbecue sauce. Teriyaki sauce. Soy sauce, including reduced-sodium. Steak sauce. Canned and packaged gravies. Fish sauce. Oyster sauce. Cocktail sauce. Horseradish that you find on the shelf. Ketchup. Mustard. Meat flavorings and tenderizers. Bouillon cubes. Hot sauce and Tabasco sauce. Premade or packaged marinades. Premade or packaged taco seasonings. Relishes. Regular salad dressings. Where to find more information:  National Heart, Lung, and Blood Institute: www.nhlbi.nih.gov  American Heart Association: www.heart.org Summary  The DASH eating plan is a healthy eating plan that has been shown to reduce high blood pressure (hypertension). It may also reduce your risk for type 2 diabetes, heart disease, and stroke.  With the DASH eating plan, you should limit salt (sodium) intake to 2,300 mg a day. If you have hypertension, you may need to reduce your sodium intake to 1,500 mg a day.  When on the DASH eating plan, aim to eat more fresh fruits and vegetables, whole grains, lean proteins, low-fat dairy, and heart-healthy fats.  Work with your health care provider or diet and nutrition specialist (dietitian) to adjust your eating plan to your individual   calorie needs. This information is not intended to replace advice given to you by your health care provider. Make sure you discuss any questions you have with your health care provider. Document Released: 12/26/2010 Document Revised: 12/31/2015 Document Reviewed: 12/31/2015 Elsevier Interactive Patient Education  2017 Elsevier Inc.  

## 2016-12-25 NOTE — Telephone Encounter (Signed)
Received fax from [employer], pt has not worn monitor.I called pt, Cristina Munoz said Dr Tracie HarrierHagler told her Cristina Munoz didn't need to wear it and it was because of all the coffee Cristina Munoz was drinking caused her palpitations.Cristina Munoz has not returned monitor.I told her there is a brown [employer] drop box in front of the [facility] and Cristina Munoz agreed to mail it back.I  informed [employer] rep, Fatima BlankMitch Daniel

## 2016-12-25 NOTE — Progress Notes (Signed)
Patient ID: Cristina Munoz, female    DOB: [birth year], 66 y.o.   MRN: [id number]  Chief Complaint  Patient presents with  . Follow-up    Allergies Fish allergy and Tomato  Subjective:   Cristina Munoz is a 66 y.o. female who presents to Cristina Munoz today.  HPI Cristina Munoz presents for follow-up today.  She reports she has been doing well and feeling much better.  She has had no subsequent pain in her back since she went to the emergency department.  She did have CT of the chest performed in the ED which revealed resolution of the pulmonary embolism.  She has had no further pain in her back, chest, or any shortness of breath.  She has been compliant with her Eliquis.  She reports that since coming in for the palpitations and seen the cardiologist, she has quit her caffeine intake.  She has had no subsequent palpitations.  She does not want to do the monitor for her heart.  She did have an echo which was normal.  She saw the allergist yesterday for the anaphylaxis/angioedema.  She has not gotten the lab test yet.  She has not gotten her EpiPen but plans to get that today.  She denies any wheezing, skin rashes, swelling of her lips, or allergic reactions.  She is to pick up the allergy medicine that was prescribed.  She is going to get the lab work done from her last visit today.  She reports she forgot last time and did not get it done.  She reports that she has been taking the HCTZ for blood pressure every day.  Reports that she feels good.  Energy is good.  Mood is good.  No increased swelling in her legs.  Denies any numbness or tingling.  Reports that she still has the knee pain which bothers her on a daily basis.  The pain in her knees make it difficult to walk or exercise.  Did not follow back up with Cristina Munoz yet.  Has not gotten a flu shot this year and is never had a pneumonia shot.  Is not up-to-date with her mammogram.  Has never been screened for hepatitis C.   Denies tobacco alcohol or drug use.  Is still living with her family because she does not have furniture for a new apartment.  Reports she is happy with her living situation.  Enjoys helping take Munoz of her great grandchildren.  Appetite is good.   Hypertension  This is a chronic problem. The current episode started more than 1 month ago. The problem has been gradually improving since onset. The problem is controlled. Pertinent negatives include no anxiety, blurred vision, chest pain, headaches, malaise/fatigue, neck pain, orthopnea, palpitations, PND or shortness of breath. There are no associated agents to hypertension. Risk factors for coronary artery disease include family history, obesity, post-menopausal state, stress and sedentary lifestyle. Past treatments include diuretics and lifestyle changes. The current treatment provides significant improvement. Compliance problems include diet and exercise.  There is no history of angina, kidney disease, CAD/MI, CVA, heart failure or PVD.    Past Medical History:  Diagnosis Date  . Arthritis   . Asthma   . Chronic knee pain   . DVT (deep venous thrombosis) (HCC)   . Hypertension   . Pulmonary embolism [facility](HCC)     Past Surgical History:  Procedure Laterality Date  . TUBAL LIGATION      Family History  Problem Relation  Age of Onset  . Hypertension Mother   . Diabetes Mother   . Dementia Mother      Social History   Socioeconomic History  . Marital status: Widowed    Spouse name: None  . Number of children: None  . Years of education: None  . Highest education level: None  Social Needs  . Financial resource strain: None  . Food insecurity - worry: None  . Food insecurity - inability: None  . Transportation needs - medical: None  . Transportation needs - non-medical: None  Occupational History  . Occupation: retired  Tobacco Use  . Smoking status: Never Smoker  . Smokeless tobacco: Never Used  Substance and Sexual Activity    . Alcohol use: No  . Drug use: No  . Sexual activity: No  Other Topics Concern  . None  Social History Narrative   Widowed. Has a living son and daughter. Does not smoke. Attends church. Has grandchildren, six.     Review of Systems  Constitutional: Negative for activity change, appetite change, fever and malaise/fatigue.  HENT: Negative for nosebleeds.   Eyes: Negative for blurred vision and visual disturbance.  Respiratory: Negative for cough, chest tightness and shortness of breath.   Cardiovascular: Negative for chest pain, palpitations, orthopnea, leg swelling and PND.  Gastrointestinal: Negative for abdominal pain, diarrhea, nausea and vomiting.  Endocrine: Negative for polyphagia and polyuria.  Genitourinary: Negative for dysuria, frequency and urgency.  Musculoskeletal: Positive for arthralgias, gait problem and joint swelling. Negative for neck pain.       Pain secondary to bilateral knee arthritis.  Skin: Negative for rash.  Neurological: Negative for dizziness, syncope, speech difficulty, weakness, light-headedness, numbness and headaches.  Hematological: Negative for adenopathy.  Psychiatric/Behavioral: Negative for dysphoric mood and sleep disturbance. The patient is not nervous/anxious.      Objective:   BP 130/80 (BP Location: Left Arm, Patient Position: Sitting, Cuff Size: Normal)   Pulse 96   Temp 98.2 F (36.8 C) (Other (Comment))   Resp 16   Ht 5\' 6"  (1.676 m)   Wt 238 lb 4 oz (108.1 kg)   SpO2 99%   BMI 38.45 kg/m   Physical Exam  Constitutional: She is oriented to person, place, and time. She appears well-developed and well-nourished. No distress.  HENT:  Head: Normocephalic and atraumatic.  Eyes: Pupils are equal, round, and reactive to light.  Neck: Normal range of motion. Neck supple. No JVD present. No tracheal deviation present. No thyromegaly present.  Cardiovascular: Normal rate, regular rhythm and normal heart sounds.  Pulmonary/Chest:  Effort normal and breath sounds normal. No respiratory distress.  Musculoskeletal:       Right knee: She exhibits decreased range of motion. Tenderness found.       Left knee: She exhibits decreased range of motion, swelling and deformity. Tenderness found.  Neurological: She is alert and oriented to person, place, and time. No cranial nerve deficit.  Skin: Skin is warm and dry. No rash noted.  Psychiatric: She has a normal mood and affect. Her behavior is normal. Judgment and thought content normal.  Nursing note and vitals reviewed.    Assessment and Plan  1. Chronic pain of both knees Refer back to Dr. Romeo Apple for possible knee replacement. - AMB referral to orthopedics  2. Pulmonary embolism on left [facility]) CT scan with patient that was performed at the emergency department.  CT scan of the chest revealed that the clots in her lungs were no longer there.  Due to her history, it was reiterated to patient that she needs to continue the Eliquis twice a day for the rest of her life.  She voiced understanding.  We did again discuss compliance with medication. - apixaban (ELIQUIS) 5 MG TABS tablet; Take 1 tablet (5 mg total) by mouth 2 (two) times daily.  Dispense: 180 tablet; Refill: 3  3. Other pulmonary embolism without acute cor pulmonale, unspecified chronicity (HCC)  4. Need for hepatitis C screening test Discussed the need for screening and current recommendations.  Patient agreeable to testing. - Hepatitis C Antibody  5. Immunization due  - Pneumococcal conjugate vaccine 13-valent IM - Flu vaccine greater than or equal to 3yo preservative free IM  6. Screening for breast cancer Clinical breast exam recommended, patient will follow-up for physical.  Continue with monthly self breast exams.  Referral for mammogram placed. - MM SCREENING BREAST TOMO BILATERAL; Future  7. Essential hypertension Blood pressure improved and stabilized on addition of HCTZ 25 mg 1 p.o. daily.   Potassium was checked in the emergency department last week which was within normal limits.  Therefore will not recheck today.  She was counseled concerning the need for her blood pressure medication, continued monitoring of her salt intake, and weight loss was recommended.  Lifestyle modifications discussed with patient including a diet emphasizing vegetables, fruits, and whole grains. Limiting intake of sodium to less than 2,400 mg per day.  Recommendations discussed include consuming low-fat dairy products, poultry, fish, legumes, non-tropical vegetable oils, and nuts; and limiting intake of sweets, sugar-sweetened beverages, and red meat. Discussed following a plan such as the Dietary Approaches to Stop Hypertension (DASH) diet. Patient to read up on this diet.    8. Palpitations Patient's office visit with cardiology was reviewed and discussed.  She reports that since she was last seen at the cardiologist she has decreased her coffee intake and basically stopped all caffeine intake.  She has not experienced any palpitations.  She does not want to do the monitor.  She will return to cardiologist office.  She will get her lab testing today related to the palpitations including her TSH. Counseled concerning worrisome signs and symptoms and if develop return to office or call 911.  Patient voiced understanding. 9.  Elevated blood sugar. Patient will get her lab testing today for elevated blood sugar.  She will get the hemoglobin A1c.  She is at risk for diabetes due to weight, other cardiovascular risk, and family history.  Weight loss and diet was discussed today.  We will follow-up after testing. Return in about 3 months (around 03/25/2017) for follow up. Aliene Beamsachel Keyante Durio, MD 12/25/2016

## 2016-12-26 ENCOUNTER — Encounter: Payer: Self-pay | Admitting: Family Medicine

## 2016-12-26 LAB — HEPATITIS C ANTIBODY
HEP C AB: NONREACTIVE
SIGNAL TO CUT-OFF: 0.03 (ref <1.00)

## 2016-12-31 ENCOUNTER — Ambulatory Visit (HOSPITAL_COMMUNITY): Payer: Medicare Other

## 2017-01-08 ENCOUNTER — Ambulatory Visit (HOSPITAL_COMMUNITY)
Admission: RE | Admit: 2017-01-08 | Discharge: 2017-01-08 | Disposition: A | Discharge status: Home / Self Care | Payer: Medicare Other | Source: Ambulatory Visit | Attending: Family Medicine | Admitting: Family Medicine

## 2017-01-08 ENCOUNTER — Other Ambulatory Visit: Payer: Self-pay | Admitting: Allergy & Immunology

## 2017-01-08 DIAGNOSIS — Z1231 Encounter for screening mammogram for malignant neoplasm of breast: Secondary | ICD-10-CM

## 2017-01-08 DIAGNOSIS — T783XXD Angioneurotic edema, subsequent encounter: Secondary | ICD-10-CM | POA: Diagnosis not present

## 2017-01-08 DIAGNOSIS — J310 Chronic rhinitis: Secondary | ICD-10-CM | POA: Diagnosis not present

## 2017-01-11 LAB — IGE+ALLERGENS ZONE 2(30)
Alternaria Alternata IgE: <0.1 kU/L
Amer Sycamore IgE Qn: <0.1 kU/L
Cedar, Mountain IgE: <0.1 kU/L
Cladosporium Herbarum IgE: <0.1 kU/L
D Farinae IgE: <0.1 kU/L
D Pteronyssinus IgE: <0.1 kU/L
Elm, American IgE: <0.1 kU/L
Hickory, White IgE: <0.1 kU/L
IgE (Immunoglobulin E), Serum: 104 IU/mL — ABNORMAL HIGH (ref 0–100)
Nettle IgE: <0.1 kU/L
Penicillium Chrysogen IgE: <0.1 kU/L
Pigweed, Rough IgE: <0.1 kU/L
Sheep Sorrel IgE Qn: <0.1 kU/L

## 2017-01-11 LAB — ALLERGEN PROFILE, SHELLFISH
Clam IgE: <0.1 kU/L
F023-IgE Crab: <0.1 kU/L
F290-IgE Oyster: <0.1 kU/L
Scallop IgE: <0.1 kU/L
Shrimp IgE: <0.1 kU/L

## 2017-01-11 LAB — TRYPTASE: TRYPTASE: 5.8 ug/L (ref 2.2–13.2)

## 2017-01-11 LAB — ALLERGEN PROFILE, FOOD-FISH
Codfish IgE: <0.1 kU/L
Halibut IgE: <0.1 kU/L

## 2017-01-11 LAB — ALLERGEN, TOMATO F25

## 2017-01-14 ENCOUNTER — Other Ambulatory Visit: Payer: Self-pay | Admitting: Family Medicine

## 2017-01-14 DIAGNOSIS — R928 Other abnormal and inconclusive findings on diagnostic imaging of breast: Secondary | ICD-10-CM

## 2017-01-27 ENCOUNTER — Ambulatory Visit (INDEPENDENT_AMBULATORY_CARE_PROVIDER_SITE_OTHER): Payer: Medicare Other | Admitting: Orthopaedic Surgery

## 2017-01-27 ENCOUNTER — Ambulatory Visit (INDEPENDENT_AMBULATORY_CARE_PROVIDER_SITE_OTHER): Payer: Medicare Other

## 2017-01-27 ENCOUNTER — Encounter (HOSPITAL_COMMUNITY): Payer: Medicare Other

## 2017-01-27 ENCOUNTER — Encounter: Payer: Self-pay | Admitting: Orthopaedic Surgery

## 2017-01-27 VITALS — BP 145/92 | HR 63 | Temp 97.2°F | Ht 67.0 in | Wt 238.0 lb

## 2017-01-27 DIAGNOSIS — M25562 Pain in left knee: Secondary | ICD-10-CM | POA: Diagnosis not present

## 2017-01-27 DIAGNOSIS — G8929 Other chronic pain: Secondary | ICD-10-CM | POA: Diagnosis not present

## 2017-01-27 NOTE — Progress Notes (Signed)
Subjective:    Patient ID: Cristina Munoz, female    DOB: Jun 26, 1950, 67 y.o.   MRN: [id number]  HPI She has chronic pain of the left knee getting worse over the last few months. She has swelling and popping. She has some giving way.  She has no trauma, no redness, no numbness.  She has tried  rubs, heat and ice with little help.  She is tired of hurting.  She has pain with walking as well.   Review of Systems  HENT: Negative for congestion.   Respiratory: Positive for shortness of breath. Negative for cough.   Cardiovascular: Positive for leg swelling. Negative for chest pain.  Endocrine: Positive for cold intolerance.  Musculoskeletal: Positive for arthralgias, gait problem and joint swelling.  Allergic/Immunologic: Positive for environmental allergies.  All other systems reviewed and are negative.  Past Medical History:  Diagnosis Date  . Arthritis   . Asthma   . Chronic knee pain   . DVT (deep venous thrombosis) (HCC)   . GERD (gastroesophageal reflux disease)   . Hypertension   . Pulmonary embolism [facility](HCC)     Past Surgical History:  Procedure Laterality Date  . TUBAL LIGATION      Current Outpatient Medications on File Prior to Visit  Medication Sig Dispense Refill  . albuterol (PROVENTIL HFA;VENTOLIN HFA) 108 (90 Base) MCG/ACT inhaler Inhale 2 puffs into the lungs every 6 (six) hours as needed for wheezing or shortness of breath. 1 Inhaler 0  . apixaban (ELIQUIS) 5 MG TABS tablet Take 1 tablet (5 mg total) by mouth 2 (two) times daily. 180 tablet 3  . cetirizine (ZYRTEC) 10 MG tablet Take 1 tablet (10 mg total) by mouth daily. 30 tablet 5  . diphenhydrAMINE (BENADRYL) 25 mg capsule Take 25 mg by mouth daily.     . fluticasone (FLONASE) 50 MCG/ACT nasal spray Place 1 spray into both nostrils daily. 16 g 5  . hydrochlorothiazide (HYDRODIURIL) 25 MG tablet Take 1 tablet (25 mg total) by mouth daily. 90 tablet 3  . HYDROcodone-acetaminophen (NORCO/VICODIN) 5-325 MG tablet  1 or 2 tabs PO q8 hours prn pain 12 tablet 0  . [DISCONTINUED] famotidine (PEPCID) 20 MG tablet Take 1 tablet (20 mg total) by mouth 2 (two) times daily. (Patient not taking: Reported on 06/30/2015) 20 tablet 0   No current facility-administered medications on file prior to visit.     Social History   Socioeconomic History  . Marital status: Widowed    Spouse name: Not on file  . Number of children: Not on file  . Years of education: Not on file  . Highest education level: Not on file  Social Needs  . Financial resource strain: Not on file  . Food insecurity - worry: Not on file  . Food insecurity - inability: Not on file  . Transportation needs - medical: Not on file  . Transportation needs - non-medical: Not on file  Occupational History  . Occupation: retired  Tobacco Use  . Smoking status: Never Smoker  . Smokeless tobacco: Never Used  Substance and Sexual Activity  . Alcohol use: No  . Drug use: No  . Sexual activity: No  Other Topics Concern  . Not on file  Social History Narrative   Widowed. Has a living son and daughter. Does not smoke. Attends church. Has grandchildren, six.     Family History  Problem Relation Age of Onset  . Hypertension Mother   . Diabetes Mother   .  Dementia Mother   . Arthritis Mother   . Arthritis Father     BP (!) 145/92   Pulse 63   Temp (!) 97.2 F (36.2 C)   Ht 5\' 7"  (1.702 m)   Wt 238 lb (108 kg)   BMI 37.28 kg/m      Objective:   Physical Exam  Constitutional: She is oriented to person, place, and time. She appears well-developed and well-nourished.  HENT:  Head: Normocephalic and atraumatic.  Eyes: Conjunctivae and EOM are normal. Pupils are equal, round, and reactive to light.  Neck: Normal range of motion. Neck supple.  Cardiovascular: Normal rate, regular rhythm and intact distal pulses.  Pulmonary/Chest: Effort normal.  Abdominal: Soft.  Musculoskeletal: She exhibits tenderness (Left knee tender, ROM 0 to 105,  crepitus, medial joint line pain, limp left, weakly positive medial McMurray, right knee with crepitus ROM 0-115.).  Neurological: She is alert and oriented to person, place, and time. She displays normal reflexes. No cranial nerve deficit. She exhibits normal muscle tone. Coordination normal.  Skin: Skin is warm and dry.  Psychiatric: She has a normal mood and affect. Her behavior is normal. Judgment and thought content normal.  Vitals reviewed.  X-rays were done of the left knee, reported separately.       Assessment & Plan:   Encounter Diagnosis  Name Primary?  . Chronic pain of left knee Yes   She is taking an anti-coagulate and cannot take NSAIDs.  PROCEDURE NOTE:  The patient requests injections of the left knee , verbal consent was obtained.  The left knee was prepped appropriately after time out was performed.   Sterile technique was observed and injection of 1 cc of Depo-Medrol 40 mg with several cc's of plain xylocaine. Anesthesia was provided by ethyl chloride and a 20-gauge needle was used to inject the knee area. The injection was tolerated well.  A band aid dressing was applied.  The patient was advised to apply ice later today and tomorrow to the injection sight as needed.  Return in two weeks.  Take Tylenol.  Call if any problem.  Precautions discussed.   Electronically Signed Darreld Mclean, MD 1/8/201910:38 AM

## 2017-01-28 ENCOUNTER — Telehealth: Payer: Self-pay | Admitting: Family Medicine

## 2017-01-28 NOTE — Telephone Encounter (Signed)
Left message with patient informing of appointment for [place]

## 2017-02-03 ENCOUNTER — Ambulatory Visit (HOSPITAL_COMMUNITY)
Admission: RE | Admit: 2017-02-03 | Discharge: 2017-02-03 | Disposition: A | Discharge status: Home / Self Care | Payer: Medicare Other | Source: Ambulatory Visit | Attending: Family Medicine | Admitting: Family Medicine

## 2017-02-03 DIAGNOSIS — R928 Other abnormal and inconclusive findings on diagnostic imaging of breast: Secondary | ICD-10-CM

## 2017-02-03 DIAGNOSIS — R922 Inconclusive mammogram: Secondary | ICD-10-CM | POA: Diagnosis not present

## 2017-02-03 DIAGNOSIS — N6001 Solitary cyst of right breast: Secondary | ICD-10-CM | POA: Insufficient documentation

## 2017-02-09 ENCOUNTER — Encounter: Payer: Self-pay | Admitting: Cardiovascular Disease

## 2017-02-09 ENCOUNTER — Ambulatory Visit (INDEPENDENT_AMBULATORY_CARE_PROVIDER_SITE_OTHER): Payer: Medicare Other | Admitting: Cardiovascular Disease

## 2017-02-09 VITALS — BP 142/86 | HR 89 | Ht 67.0 in | Wt 237.0 lb

## 2017-02-09 DIAGNOSIS — I10 Essential (primary) hypertension: Secondary | ICD-10-CM

## 2017-02-09 DIAGNOSIS — R002 Palpitations: Secondary | ICD-10-CM

## 2017-02-09 DIAGNOSIS — I2699 Other pulmonary embolism without acute cor pulmonale: Secondary | ICD-10-CM

## 2017-02-09 MED ORDER — AMLODIPINE BESYLATE 5 MG PO TABS: 5.0000 mg | ORAL_TABLET | Freq: Every day | ORAL | 3 refills | Status: DC

## 2017-02-09 NOTE — Patient Instructions (Signed)
Your physician recommends that you schedule a follow-up appointment in: as needed with Dr.Koneswaran     START Amlodipine 5 mg daily     If you need a refill on your cardiac medications before your next appointment, please call your pharmacy.      No lab work or tests ordered today.      Thank you for choosing [facility] !

## 2017-02-09 NOTE — Progress Notes (Signed)
SUBJECTIVE: The patient returns for follow-up after undergoing cardiovascular testing performed for the evaluation of palpitations.  CT angiogram of the chest on 12/19/16 demonstrated resolved pulmonary emboli with no new emboli seen.  There was minimal calcific aortic atherosclerosis.  She did not wear the event monitor I ordered.  She told my nurse on 12/25/16 that she was told by her PCP she did not need to wear a monitor and her palpitations were due to all the coughing she was drinking.  I reviewed PCP notes dated 12/25/16.  The patient told her PCP that once quitting caffeine intake, her palpitations resolved and she did not want to wear the cardiac monitor.  At her initial visit with me on 12/08/16, she told me she occasionally drank a cup of coffee.  She denies chest pain, palpitations, and shortness of breath.  She has cut out caffeine altogether.  Blood pressure today is 142/86.  It was 130/80 at her PCPs office on 12/25/16, and 154/89 on 12/19/16.   Family history: Brother had pulmonary embolism. One son who was murdered 2 years ago had a pulmonary embolism. Another son has DVT.  Review of Systems: As per "subjective", otherwise negative.  Allergies  Allergen Reactions  . Fish Allergy Other (See Comments)    Mouth and lips swell  . Tomato Swelling    Current Outpatient Medications  Medication Sig Dispense Refill  . albuterol (PROVENTIL HFA;VENTOLIN HFA) 108 (90 Base) MCG/ACT inhaler Inhale 2 puffs into the lungs every 6 (six) hours as needed for wheezing or shortness of breath. 1 Inhaler 0  . apixaban (ELIQUIS) 5 MG TABS tablet Take 1 tablet (5 mg total) by mouth 2 (two) times daily. 180 tablet 3  . cetirizine (ZYRTEC) 10 MG tablet Take 1 tablet (10 mg total) by mouth daily. 30 tablet 5  . diphenhydrAMINE (BENADRYL) 25 mg capsule Take 25 mg by mouth daily.     . fluticasone (FLONASE) 50 MCG/ACT nasal spray Place 1 spray into both nostrils daily. 16 g 5  .  hydrochlorothiazide (HYDRODIURIL) 25 MG tablet Take 1 tablet (25 mg total) by mouth daily. 90 tablet 3  . HYDROcodone-acetaminophen (NORCO/VICODIN) 5-325 MG tablet 1 or 2 tabs PO q8 hours prn pain 12 tablet 0   No current facility-administered medications for this visit.     Past Medical History:  Diagnosis Date  . Arthritis   . Asthma   . Chronic knee pain   . DVT (deep venous thrombosis) (HCC)   . GERD (gastroesophageal reflux disease)   . Hypertension   . Pulmonary embolism [facility])     Past Surgical History:  Procedure Laterality Date  . TUBAL LIGATION      Social History   Socioeconomic History  . Marital status: Widowed    Spouse name: Not on file  . Number of children: Not on file  . Years of education: Not on file  . Highest education level: Not on file  Social Needs  . Financial resource strain: Not on file  . Food insecurity - worry: Not on file  . Food insecurity - inability: Not on file  . Transportation needs - medical: Not on file  . Transportation needs - non-medical: Not on file  Occupational History  . Occupation: retired  Tobacco Use  . Smoking status: Never Smoker  . Smokeless tobacco: Never Used  Substance and Sexual Activity  . Alcohol use: No  . Drug use: No  . Sexual activity: No  Other  Topics Concern  . Not on file  Social History Narrative   Widowed. Has a living son and daughter. Does not smoke. Attends church. Has grandchildren, six.      Vitals:   02/09/17 0813  BP: (!) 142/86  Pulse: 89  SpO2: 99%  Weight: 237 lb (107.5 kg)  Height: 5\' 7"  (1.702 m)    Wt Readings from Last 3 Encounters:  02/09/17 237 lb (107.5 kg)  01/27/17 238 lb (108 kg)  12/25/16 238 lb 4 oz (108.1 kg)     PHYSICAL EXAM General: NAD HEENT: Normal. Neck: No JVD, no thyromegaly. Lungs: Clear to auscultation bilaterally with normal respiratory effort. CV: Regular rate and rhythm, normal S1/S2, no S3/S4, no murmur. No pretibial or periankle  edema. Abdomen: Soft, nontender, no distention.  Neurologic: Alert and oriented.  Psych: Normal affect. Skin: Normal. Musculoskeletal: No gross deformities.    ECG: Most recent ECG reviewed.   Labs: Lab Results  Component Value Date/Time   K 4.1 12/19/2016 03:54 PM   BUN 26 (H) 12/19/2016 03:54 PM   CREATININE 0.95 12/19/2016 03:54 PM   ALT 11 (L) 12/19/2016 03:54 PM   HGB 11.4 (L) 12/19/2016 03:54 PM     Lipids: No results found for: LDLCALC, LDLDIRECT, CHOL, TRIG, HDL     ASSESSMENT AND PLAN: 1.  Palpitations: Symptoms have subsided since caffeine cessation.  Echocardiogram was unremarkable.    She declined the event monitor which I previously ordered.  No further testing is indicated at this time.  2.  Left-sided pulmonary embolism which appear to be unprovoked as well as left popliteal DVT: Currently on Eliquis.  There is a strong family history of clotting disorders.  She should remain on Eliquis lifelong.  3.  Essential hypertension: Elevated blood pressure on hydrochlorothiazide 25 mg.  She has had multiple elevated blood pressure readings.  I will add amlodipine 5 mg daily.  4.  Left-sided chest pain: This has resolved.     Disposition: Follow up as needed   Prentice DockerSuresh Koneswaran, M.D., F.A.C.C.

## 2017-02-10 ENCOUNTER — Encounter: Payer: Self-pay | Admitting: Orthopaedic Surgery

## 2017-02-10 ENCOUNTER — Ambulatory Visit (INDEPENDENT_AMBULATORY_CARE_PROVIDER_SITE_OTHER): Payer: Medicare Other | Admitting: Orthopaedic Surgery

## 2017-02-10 VITALS — BP 131/90 | HR 71 | Temp 97.1°F | Ht 67.0 in | Wt 237.0 lb

## 2017-02-10 DIAGNOSIS — M25562 Pain in left knee: Secondary | ICD-10-CM

## 2017-02-10 DIAGNOSIS — G8929 Other chronic pain: Secondary | ICD-10-CM | POA: Diagnosis not present

## 2017-02-10 NOTE — Progress Notes (Signed)
Patient WU:[id number] Cristina Munoz, female DOB:20-Oct-1950, 67 y.o. NWG:[id number]  Chief Complaint  Patient presents with  . Follow-up    LEFT KNEE    HPI  Cristina Munoz is a 67 y.o. female who has continued pain of the left knee.  She has no new trauma.  She has tendency for it to give way.  She has no redness.  The injection helped but she still hurts.  She needs MRI but needs to wait another four weeks for insurance reasons. HPI  Body mass index is 37.12 kg/m.  ROS  Review of Systems  HENT: Negative for congestion.   Respiratory: Positive for shortness of breath. Negative for cough.   Cardiovascular: Positive for leg swelling. Negative for chest pain.  Endocrine: Positive for cold intolerance.  Musculoskeletal: Positive for arthralgias, gait problem and joint swelling.  Allergic/Immunologic: Positive for environmental allergies.  All other systems reviewed and are negative.   Past Medical History:  Diagnosis Date  . Arthritis   . Asthma   . Chronic knee pain   . DVT (deep venous thrombosis) (HCC)   . GERD (gastroesophageal reflux disease)   . Hypertension   . Pulmonary embolism [facility])     Past Surgical History:  Procedure Laterality Date  . TUBAL LIGATION      Family History  Problem Relation Age of Onset  . Hypertension Mother   . Diabetes Mother   . Dementia Mother   . Arthritis Mother   . Arthritis Father     Social History Social History   Tobacco Use  . Smoking status: Never Smoker  . Smokeless tobacco: Never Used  Substance Use Topics  . Alcohol use: No  . Drug use: No    Allergies  Allergen Reactions  . Fish Allergy Other (See Comments)    Mouth and lips swell  . Tomato Swelling    Current Outpatient Medications  Medication Sig Dispense Refill  . albuterol (PROVENTIL HFA;VENTOLIN HFA) 108 (90 Base) MCG/ACT inhaler Inhale 2 puffs into the lungs every 6 (six) hours as needed for wheezing or shortness of breath. 1 Inhaler 0  . amLODipine  (NORVASC) 5 MG tablet Take 1 tablet (5 mg total) by mouth daily. 90 tablet 3  . apixaban (ELIQUIS) 5 MG TABS tablet Take 1 tablet (5 mg total) by mouth 2 (two) times daily. 180 tablet 3  . cetirizine (ZYRTEC) 10 MG tablet Take 1 tablet (10 mg total) by mouth daily. 30 tablet 5  . diphenhydrAMINE (BENADRYL) 25 mg capsule Take 25 mg by mouth daily.     . fluticasone (FLONASE) 50 MCG/ACT nasal spray Place 1 spray into both nostrils daily. 16 g 5  . hydrochlorothiazide (HYDRODIURIL) 25 MG tablet Take 1 tablet (25 mg total) by mouth daily. 90 tablet 3  . HYDROcodone-acetaminophen (NORCO/VICODIN) 5-325 MG tablet 1 or 2 tabs PO q8 hours prn pain 12 tablet 0   No current facility-administered medications for this visit.      Physical Exam  Blood pressure 131/90, pulse 71, temperature (!) 97.1 F (36.2 C), height 5\' 7"  (1.702 m), weight 237 lb (107.5 kg).  Constitutional: overall normal hygiene, normal nutrition, well developed, normal grooming, normal body habitus. Assistive device:none  Musculoskeletal: gait and station Limp left, muscle tone and strength are normal, no tremors or atrophy is present.  .  Neurological: coordination overall normal.  Deep tendon reflex/nerve stretch intact.  Sensation normal.  Cranial nerves II-XII intact.   Skin:   Normal overall no scars, lesions,  ulcers or rashes. No psoriasis.  Psychiatric: Alert and oriented x 3.  Recent memory intact, remote memory unclear.  Normal mood and affect. Well groomed.  Good eye contact.  Cardiovascular: overall no swelling, no varicosities, no edema bilaterally, normal temperatures of the legs and arms, no clubbing, cyanosis and good capillary refill.  Lymphatic: palpation is normal.  The left lower extremity is examined:  Inspection:  Thigh:  Non-tender and no defects  Knee has swelling 1+ effusion.                        Joint tenderness is present                        Patient is tender over the medial joint  line  Lower Leg:  Has normal appearance and no tenderness or defects  Ankle:  Non-tender and no defects  Foot:  Non-tender and no defects Range of Motion:  Knee:  Range of motion is: 0-105                        Crepitus is  present  Ankle:  Range of motion is normal. Strength and Tone:  The left lower extremity has normal strength and tone. Stability:  Knee:  The knee has positive medial McMurray  Ankle:  The ankle is stable.   All other systems reviewed and are negative   The patient has been educated about the nature of the problem(s) and counseled on treatment options.  The patient appeared to understand what I have discussed and is in agreement with it.  Encounter Diagnosis  Name Primary?  . Chronic pain of left knee Yes    PLAN Call if any problems.  Precautions discussed.  Continue current medications.   Return to clinic 1 month   Consider MRI on return. Electronically Signed Darreld McleanWayne Morrie Daywalt, MD 1/22/201910:34 AM

## 2017-02-26 ENCOUNTER — Other Ambulatory Visit: Payer: Self-pay | Admitting: Family Medicine

## 2017-02-26 DIAGNOSIS — I2699 Other pulmonary embolism without acute cor pulmonale: Secondary | ICD-10-CM

## 2017-03-10 ENCOUNTER — Encounter: Payer: Self-pay | Admitting: Orthopaedic Surgery

## 2017-03-10 ENCOUNTER — Ambulatory Visit (INDEPENDENT_AMBULATORY_CARE_PROVIDER_SITE_OTHER): Payer: Medicare Other | Admitting: Orthopaedic Surgery

## 2017-03-10 VITALS — BP 126/88 | HR 78 | Temp 97.6°F | Ht 67.0 in | Wt 234.0 lb

## 2017-03-10 DIAGNOSIS — M25562 Pain in left knee: Secondary | ICD-10-CM

## 2017-03-10 DIAGNOSIS — G8929 Other chronic pain: Secondary | ICD-10-CM | POA: Diagnosis not present

## 2017-03-10 NOTE — Progress Notes (Signed)
CC:  I have pain of my left knee. I would like an injection.  The patient has chronic pain of the left knee.  There is no recent trauma.  There is no redness.  Injections in the past have helped.  The knee has no redness, has an effusion and crepitus present.  ROM of the left knee is 0-105.  Impression:  Chronic knee pain left  Return: 1 month  PROCEDURE NOTE:  The patient requests injections of the left knee, verbal consent was obtained.  The left knee was prepped appropriately after time out was performed.   Sterile technique was observed and injection of 1 cc of Depo-Medrol 40 mg with several cc's of plain xylocaine. Anesthesia was provided by ethyl chloride and a 20-gauge needle was used to inject the knee area. The injection was tolerated well.  A band aid dressing was applied.  The patient was advised to apply ice later today and tomorrow to the injection sight as needed.  Electronically Signed Darreld McleanWayne Mohamad Bruso, MD 2/19/20199:34 AM

## 2017-03-24 ENCOUNTER — Ambulatory Visit: Payer: Medicare Other | Admitting: Allergy & Immunology

## 2017-03-25 ENCOUNTER — Ambulatory Visit (INDEPENDENT_AMBULATORY_CARE_PROVIDER_SITE_OTHER): Payer: Medicare Other | Admitting: Family Medicine

## 2017-03-25 ENCOUNTER — Other Ambulatory Visit: Payer: Self-pay

## 2017-03-25 ENCOUNTER — Encounter: Payer: Self-pay | Admitting: Family Medicine

## 2017-03-25 VITALS — BP 128/78 | HR 86 | Temp 98.1°F | Resp 16 | Ht 67.0 in | Wt 236.2 lb

## 2017-03-25 DIAGNOSIS — I10 Essential (primary) hypertension: Secondary | ICD-10-CM

## 2017-03-25 DIAGNOSIS — Z1211 Encounter for screening for malignant neoplasm of colon: Secondary | ICD-10-CM

## 2017-03-25 DIAGNOSIS — Z23 Encounter for immunization: Secondary | ICD-10-CM

## 2017-03-25 DIAGNOSIS — Z6837 Body mass index (BMI) 37.0-37.9, adult: Secondary | ICD-10-CM

## 2017-03-25 DIAGNOSIS — R739 Hyperglycemia, unspecified: Secondary | ICD-10-CM

## 2017-03-25 DIAGNOSIS — E2839 Other primary ovarian failure: Secondary | ICD-10-CM | POA: Diagnosis not present

## 2017-03-25 NOTE — Progress Notes (Signed)
Patient ID: Cristina Munoz, female    DOB: Mar 10, 1950, 67 y.o.   MRN: [id number]  Chief Complaint  Patient presents with  . Follow-up    Allergies Fish allergy and Tomato  Subjective:   Cristina Munoz is a 67 y.o. female who presents to [facility] today.  HPI Cristina Munoz presents today for follow-up visit.  She was seen by cardiology in January for evaluation of her palpitations.  At that time her blood pressure was still elevated and she was started on Norvasc 5 mg p.o. daily.  She is also been taking her triamterene/HCTZ p.o. daily.  She denies any side effects with the medication.  She presents today for blood pressure check and evaluation.  She was seen by cardiology and had subsequent resolution of her palpitations.  She had an echo which was within normal limits.  She deferred to having the Holter monitor placed which was ordered by cardiology.  She denies any further palpitations.  She denies any chest pain, shortness of breath, or swelling in her extremities.  She believes that her blood pressure has been running well but does not check on a regular basis.  She denies any headaches, nausea, vomiting, vision changes. She did not get her lab work completed for her blood sugars and cholesterol panel. She has been taking her Eliquis each day for her history of DVT/pulmonary embolism.  She reports to me that she has not missed any doses of this medication. She is also been seen by Dr. Hilda Lias for her chronic knee pain and received several injections for her arthritis. She does tell me she has been trying to work on her diet and reports that she did lose 3 pounds since her last visit.    Past Medical History:  Diagnosis Date  . Arthritis   . Asthma   . Chronic knee pain   . DVT (deep venous thrombosis) (HCC)   . GERD (gastroesophageal reflux disease)   . Hypertension   . Pulmonary embolism [facility])     Past Surgical History:  Procedure Laterality Date  . TUBAL  LIGATION      Family History  Problem Relation Age of Onset  . Hypertension Mother   . Diabetes Mother   . Dementia Mother   . Arthritis Mother   . Arthritis Father      Social History   Socioeconomic History  . Marital status: Widowed    Spouse name: None  . Number of children: None  . Years of education: None  . Highest education level: None  Social Needs  . Financial resource strain: None  . Food insecurity - worry: None  . Food insecurity - inability: None  . Transportation needs - medical: None  . Transportation needs - non-medical: None  Occupational History  . Occupation: retired  Tobacco Use  . Smoking status: Never Smoker  . Smokeless tobacco: Never Used  Substance and Sexual Activity  . Alcohol use: No  . Drug use: No  . Sexual activity: No  Other Topics Concern  . None  Social History Narrative   Widowed. Has a living son and daughter. Does not smoke. Attends church. Has grandchildren, six.    Current Outpatient Medications on File Prior to Visit  Medication Sig Dispense Refill  . albuterol (PROVENTIL HFA;VENTOLIN HFA) 108 (90 Base) MCG/ACT inhaler Inhale 2 puffs into the lungs every 6 (six) hours as needed for wheezing or shortness of breath. 1 Inhaler 0  . amLODipine (NORVASC)  5 MG tablet Take 1 tablet (5 mg total) by mouth daily. 90 tablet 3  . cetirizine (ZYRTEC) 10 MG tablet Take 1 tablet (10 mg total) by mouth daily. 30 tablet 5  . diphenhydrAMINE (BENADRYL) 25 mg capsule Take 25 mg by mouth daily.     Marland Kitchen ELIQUIS 5 MG TABS tablet TAKE 1 TABLET BY MOUTH TWICE DAILY 60 tablet 0  . EPINEPHrine 0.3 mg/0.3 mL IJ SOAJ injection INJECT 0.3 MLS INTO THE MUSCLE ONCE FOR 1 DOSE  2  . hydrochlorothiazide (HYDRODIURIL) 25 MG tablet Take 1 tablet (25 mg total) by mouth daily. 90 tablet 3  . HYDROcodone-acetaminophen (NORCO/VICODIN) 5-325 MG tablet 1 or 2 tabs PO q8 hours prn pain 12 tablet 0  . [DISCONTINUED] famotidine (PEPCID) 20 MG tablet Take 1 tablet (20 mg  total) by mouth 2 (two) times daily. (Patient not taking: Reported on 06/30/2015) 20 tablet 0   No current facility-administered medications on file prior to visit.     Review of Systems  Constitutional: Negative for activity change, appetite change, chills, fatigue, fever and unexpected weight change.  HENT: Negative for voice change.   Eyes: Negative for visual disturbance.  Respiratory: Negative for cough, chest tightness, shortness of breath and wheezing.   Cardiovascular: Negative for chest pain, palpitations and leg swelling.  Gastrointestinal: Negative for abdominal pain, diarrhea, nausea and vomiting.  Endocrine: Negative for polyphagia and polyuria.  Genitourinary: Negative for dysuria, hematuria and urgency.  Musculoskeletal: Positive for arthralgias.       Still with arthritis in her knees that bothers her on a daily basis.  Skin: Negative for rash.  Neurological: Negative for dizziness, tremors and headaches.  Hematological: Negative for adenopathy. Does not bruise/bleed easily.  Psychiatric/Behavioral: Negative for dysphoric mood. The patient is not nervous/anxious.      Objective:   BP 128/78 (BP Location: Left Arm, Patient Position: Sitting, Cuff Size: Normal)   Pulse 86   Temp 98.1 F (36.7 C) (Temporal)   Resp 16   Ht 5\' 7"  (1.702 m)   Wt 236 lb 4 oz (107.2 kg)   SpO2 99%   BMI 37.00 kg/m   Physical Exam  Constitutional: She is oriented to person, place, and time. She appears well-developed and well-nourished.  HENT:  Head: Normocephalic and atraumatic.  Eyes: EOM are normal. Pupils are equal, round, and reactive to light.  Neck: Normal range of motion. Neck supple. No thyromegaly present.  Cardiovascular: Normal rate and regular rhythm.  Pulmonary/Chest: Effort normal and breath sounds normal.  Musculoskeletal:  No edema in lower extremities bilaterally.  Neurological: She is alert and oriented to person, place, and time.  Psychiatric: She has a normal  mood and affect. Her behavior is normal. Judgment and thought content normal.  Vitals reviewed.    Assessment and Plan  1. HTN, goal below 140/90 Blood pressure improved on Maxzide and addition of amlodipine 5 mg p.o. daily.  We will plan to check her lab work today since she has not gotten it done. - Lipid panel - COMPLETE METABOLIC PANEL WITH GFR  2. Estrogen deficiency Patient is due for a bone density test.  She does not take calcium or vitamin D.  Will obtain screening for osteoporosis today. - DG Bone Density; Future  3. Hyperglycemia Patient does have a history of hyperglycemia.  Screening for diabetes performed today.  We did discuss that she needs to work on her diet and weight loss.  She is unable to do much exercise due  to her knee pain.  We did discuss that if she was able to lose some weight that this could help with the stress and strain on her knees.  Diet changes and weight loss tips discussed today. - Hemoglobin A1c  4. Screening for colon cancer Will initiate process for Colo guard screening today.  Patient agreeable and would like to proceed with the test.  I believe this is the better option for this patient due to the fact that she does not need to come off her Eliquis due to her high risk of thromboembolic disease.  Patient has no family history of colon cancer nor she ever had any colon cancer screening.  She is asymptomatic at this time. - Cologuard  5. Immunization due Patient due for vaccination and it was given today. - Td : Tetanus/diphtheria >7yo Preservative  free Office visit was 25 minutes.  Greater than 50% of visit spent counseling and coordinating care.  Patient was counseled regarding colon cancer screening, bone density, diet changes, salt reduction, weight loss, and compliance with medication.  Return in about 2 months (around 05/25/2017) for follow up. Aliene Beamsachel Colbert Curenton, MD 03/25/2017

## 2017-03-25 NOTE — Patient Instructions (Signed)
Obesity, Adult Obesity is having too much body fat. If you have a BMI of 30 or more, you are obese. BMI is a number that explains how much body fat you have. Obesity is often caused by taking in (consuming) more calories than your body uses. Obesity can cause serious health problems. Changing your lifestyle can help to treat obesity. Follow these instructions at home: Eating and drinking   Follow advice from your doctor about what to eat and drink. Your doctor may tell you to: ? Cut down on (limit) fast foods, sweets, and processed snack foods. ? Choose low-fat options. For example, choose low-fat milk instead of whole milk. ? Eat 5 or more servings of fruits or vegetables every day. ? Eat at home more often. This gives you more control over what you eat. ? Choose healthy foods when you eat out. ? Learn what a healthy portion size is. A portion size is the amount of a certain food that is healthy for you to eat at one time. This is different for each person. ? Keep low-fat snacks available. ? Avoid sugary drinks. These include soda, fruit juice, iced tea that is sweetened with sugar, and flavored milk. ? Eat a healthy breakfast.  Drink enough water to keep your pee (urine) clear or pale yellow.  Do not go without eating for long periods of time (do not fast).  Do not go on popular or trendy diets (fad diets). Physical Activity  Exercise often, as told by your doctor. Ask your doctor: ? What types of exercise are safe for you. ? How often you should exercise.  Warm up and stretch before being active.  Do slow stretching after being active (cool down).  Rest between times of being active. Lifestyle  Limit how much time you spend in front of your TV, computer, or video game system (be less sedentary).  Find ways to reward yourself that do not involve food.  Limit alcohol intake to no more than 1 drink a day for nonpregnant women and 2 drinks a day for men. One drink equals 12 oz  of beer, 5 oz of wine, or 1 oz of hard liquor. General instructions  Keep a weight loss journal. This can help you keep track of: ? The food that you eat. ? The exercise that you do.  Take over-the-counter and prescription medicines only as told by your doctor.  Take vitamins and supplements only as told by your doctor.  Think about joining a support group. Your doctor may be able to help with this.  Keep all follow-up visits as told by your doctor. This is important. Contact a doctor if:  You cannot meet your weight loss goal after you have changed your diet and lifestyle for 6 weeks. This information is not intended to replace advice given to you by your health care provider. Make sure you discuss any questions you have with your health care provider. Document Released: 03/31/2011 Document Revised: 06/14/2015 Document Reviewed: 10/25/2014 Elsevier Interactive Patient Education  2018 [employer]  

## 2017-03-26 LAB — COMPLETE METABOLIC PANEL WITH GFR
AG RATIO: 1.2 (calc) (ref 1.0–2.5)
ALBUMIN MSPROF: 4 g/dL (ref 3.6–5.1)
ALKALINE PHOSPHATASE (APISO): 84 U/L (ref 33–130)
ALT: 8 U/L (ref 6–29)
AST: 12 U/L (ref 10–35)
BILIRUBIN TOTAL: 0.5 mg/dL (ref 0.2–1.2)
BUN / CREAT RATIO: 17 (calc) (ref 6–22)
BUN: 17 mg/dL (ref 7–25)
CHLORIDE: 103 mmol/L (ref 98–110)
CO2: 32 mmol/L (ref 20–32)
Calcium: 9.6 mg/dL (ref 8.6–10.4)
Creat: 1.01 mg/dL — ABNORMAL HIGH (ref 0.50–0.99)
GFR, EST AFRICAN AMERICAN: 66 mL/min/{1.73_m2} (ref >=60)
GFR, Est Non African American: 57 mL/min/{1.73_m2} — ABNORMAL LOW (ref >=60)
GLOBULIN: 3.3 g/dL (ref 1.9–3.7)
GLUCOSE: 104 mg/dL — AB (ref 65–99)
POTASSIUM: 3.9 mmol/L (ref 3.5–5.3)
SODIUM: 142 mmol/L (ref 135–146)
TOTAL PROTEIN: 7.3 g/dL (ref 6.1–8.1)

## 2017-03-26 LAB — HEMOGLOBIN A1C
EAG (MMOL/L): 7 (calc)
HEMOGLOBIN A1C: 6 %{Hb} — AB (ref <5.7)
MEAN PLASMA GLUCOSE: 126 (calc)

## 2017-03-26 LAB — TSH: TSH: 1.34 m[IU]/L (ref 0.40–4.50)

## 2017-03-26 LAB — CBC WITH DIFFERENTIAL/PLATELET
BASOS ABS: 30 {cells}/uL (ref 0–200)
Basophils Relative: 0.5 %
EOS ABS: 59 {cells}/uL (ref 15–500)
EOS PCT: 1 %
HCT: 35.9 % (ref 35.0–45.0)
HEMOGLOBIN: 11.9 g/dL (ref 11.7–15.5)
Lymphs Abs: 2572 cells/uL (ref 850–3900)
MCH: 28.2 pg (ref 27.0–33.0)
MCHC: 33.1 g/dL (ref 32.0–36.0)
MCV: 85.1 fL (ref 80.0–100.0)
MONOS PCT: 4.6 %
MPV: 10.3 fL (ref 7.5–12.5)
NEUTROS ABS: 2968 {cells}/uL (ref 1500–7800)
NEUTROS PCT: 50.3 %
Platelets: 278 10*3/uL (ref 140–400)
RBC: 4.22 10*6/uL (ref 3.80–5.10)
RDW: 14.1 % (ref 11.0–15.0)
Total Lymphocyte: 43.6 %
WBC mixed population: 271 cells/uL (ref 200–950)
WBC: 5.9 10*3/uL (ref 3.8–10.8)

## 2017-03-26 LAB — LIPID PANEL
Cholesterol: 198 mg/dL (ref <200)
HDL: 79 mg/dL (ref >50)
LDL CHOLESTEROL (CALC): 105 mg/dL — AB
Non-HDL Cholesterol (Calc): 119 mg/dL (calc) (ref <130)
TRIGLYCERIDES: 62 mg/dL (ref <150)
Total CHOL/HDL Ratio: 2.5 (calc) (ref <5.0)

## 2017-03-31 ENCOUNTER — Encounter: Payer: Self-pay | Admitting: Family Medicine

## 2017-04-05 ENCOUNTER — Other Ambulatory Visit: Payer: Self-pay | Admitting: Family Medicine

## 2017-04-05 DIAGNOSIS — I2699 Other pulmonary embolism without acute cor pulmonale: Secondary | ICD-10-CM

## 2017-04-08 ENCOUNTER — Ambulatory Visit (INDEPENDENT_AMBULATORY_CARE_PROVIDER_SITE_OTHER): Payer: Medicare Other | Admitting: Orthopedic Surgery

## 2017-04-08 VITALS — BP 125/85 | HR 76 | Ht 67.0 in | Wt 236.0 lb

## 2017-04-08 DIAGNOSIS — G8929 Other chronic pain: Secondary | ICD-10-CM

## 2017-04-08 DIAGNOSIS — M25562 Pain in left knee: Secondary | ICD-10-CM | POA: Diagnosis not present

## 2017-04-08 NOTE — Progress Notes (Signed)
Progress Note   Patient ID: Cristina Munoz, female   DOB: 1950/09/02, 67 y.o.   MRN: [id number]  Chief Complaint  Patient presents with  . Follow-up    Recheck on left knee.    67 year old female history of multiple pulmonary emboli currently on Eliquis presents for injection left knee    No outpatient medications have been marked as taking for the 04/08/17 encounter (Office Visit) with Vickki HearingHarrison, Sherrika Weakland E, MD.    Allergies  Allergen Reactions  . Fish Allergy Other (See Comments)    Mouth and lips swell  . Tomato Swelling   Past Medical History:  Diagnosis Date  . Arthritis   . Asthma   . Chronic knee pain   . DVT (deep venous thrombosis) (HCC)   . GERD (gastroesophageal reflux disease)   . Hypertension   . Pulmonary embolism (HCC)      BP 125/85   Pulse 76   Ht 5\' 7"  (1.702 m)   Wt 236 lb (107 kg)   BMI 36.96 kg/m   Physical Exam   Medical decision-making Encounter Diagnosis  Name Primary?  . Chronic pain of left knee Yes    Procedure note left knee injection verbal consent was obtained to inject left knee joint  Timeout was completed to confirm the site of injection  The medications used were 40 mg of Depo-Medrol and 1% lidocaine 3 cc  Anesthesia was provided by ethyl chloride and the skin was prepped with alcohol.  After cleaning the skin with alcohol a 20-gauge needle was used to inject the left knee joint. There were no complications. A sterile bandage was applied.   1 mo inject left knee  Cristina CanadaStanley Kellar Westberg, MD 04/08/2017 11:04 AM

## 2017-05-14 ENCOUNTER — Other Ambulatory Visit: Payer: Self-pay | Admitting: Family Medicine

## 2017-05-14 DIAGNOSIS — I2699 Other pulmonary embolism without acute cor pulmonale: Secondary | ICD-10-CM

## 2017-05-25 ENCOUNTER — Ambulatory Visit: Payer: Self-pay | Admitting: Orthopedic Surgery

## 2017-05-25 ENCOUNTER — Encounter: Payer: Self-pay | Admitting: Orthopedic Surgery

## 2017-05-26 ENCOUNTER — Ambulatory Visit: Payer: Medicare Other | Admitting: Family Medicine

## 2017-06-10 ENCOUNTER — Ambulatory Visit: Payer: Medicare Other | Admitting: Orthopaedic Surgery

## 2017-06-11 ENCOUNTER — Ambulatory Visit (INDEPENDENT_AMBULATORY_CARE_PROVIDER_SITE_OTHER): Payer: Medicare Other | Admitting: Orthopaedic Surgery

## 2017-06-11 ENCOUNTER — Encounter: Payer: Self-pay | Admitting: Orthopaedic Surgery

## 2017-06-11 DIAGNOSIS — M25561 Pain in right knee: Secondary | ICD-10-CM | POA: Diagnosis not present

## 2017-06-11 DIAGNOSIS — M25562 Pain in left knee: Secondary | ICD-10-CM

## 2017-06-11 DIAGNOSIS — G8929 Other chronic pain: Secondary | ICD-10-CM | POA: Diagnosis not present

## 2017-06-11 NOTE — Progress Notes (Signed)
CC: Both of my knees are hurting. I would like an injection in both knees.  The patient has had chronic pain and tenderness of both knees for some time.  Injections help.  There is no locking or giving way of the knee.  There is no new trauma. There is no redness or signs of infections.  The knees have a mild effusion and some crepitus.  There is no redness or signs of recent trauma.  Right knee ROM is 0-95 and left knee ROM is 0-105.  Impression:  Chronic pain of the both knees  Return:  2 months  PROCEDURE NOTE:  The patient requests injections of both knees, verbal consent was obtained.  The left and right knee were individually prepped appropriately after time out was performed.   Sterile technique was observed and injection of 1 cc of Depo-Medrol 40 mg with several cc's of plain xylocaine. Anesthesia was provided by ethyl chloride and a 20-gauge needle was used to inject each knee area. The injections were tolerated well.  A band aid dressing was applied.  The patient was advised to apply ice later today and tomorrow to the injection sight as needed.   Rx given for cane today.  Call if any problem.  Precautions discussed.   Electronically Signed Darreld Mclean, MD 5/23/20199:17 AM

## 2017-06-19 ENCOUNTER — Ambulatory Visit: Payer: Medicare Other | Admitting: Orthopedic Surgery

## 2017-06-19 ENCOUNTER — Encounter

## 2017-06-25 ENCOUNTER — Other Ambulatory Visit: Payer: Self-pay | Admitting: Family Medicine

## 2017-06-25 ENCOUNTER — Encounter: Payer: Self-pay | Admitting: Family Medicine

## 2017-06-25 DIAGNOSIS — I2699 Other pulmonary embolism without acute cor pulmonale: Secondary | ICD-10-CM

## 2017-06-26 ENCOUNTER — Encounter: Payer: Self-pay | Admitting: Family Medicine

## 2017-06-27 ENCOUNTER — Emergency Department (HOSPITAL_COMMUNITY)
Admission: EM | Admit: 2017-06-27 | Discharge: 2017-06-27 | Disposition: A | Discharge status: Home / Self Care | Payer: Medicare Other | Attending: Emergency Medicine | Admitting: Emergency Medicine

## 2017-06-27 ENCOUNTER — Encounter (HOSPITAL_COMMUNITY): Payer: Self-pay

## 2017-06-27 DIAGNOSIS — M25561 Pain in right knee: Secondary | ICD-10-CM | POA: Diagnosis not present

## 2017-06-27 DIAGNOSIS — I10 Essential (primary) hypertension: Secondary | ICD-10-CM | POA: Insufficient documentation

## 2017-06-27 DIAGNOSIS — J45909 Unspecified asthma, uncomplicated: Secondary | ICD-10-CM | POA: Insufficient documentation

## 2017-06-27 DIAGNOSIS — Z79899 Other long term (current) drug therapy: Secondary | ICD-10-CM | POA: Insufficient documentation

## 2017-06-27 DIAGNOSIS — Z7901 Long term (current) use of anticoagulants: Secondary | ICD-10-CM | POA: Insufficient documentation

## 2017-06-27 MED ORDER — HYDROCODONE-ACETAMINOPHEN 5-325 MG PO TABS: 1.0000 | ORAL_TABLET | Freq: Four times a day (QID) | ORAL | 0 refills | Status: DC | PRN

## 2017-06-27 NOTE — ED Provider Notes (Signed)
[facility] EMERGENCY DEPARTMENT Provider Note   CSN: [id number] Arrival date & time: 06/27/17  0122     History   Chief Complaint Chief Complaint  Patient presents with  . Knee Pain    HPI Cristina Munoz is a 67 y.o. female.  This patient is a 67 year old female with history of osteoarthritis of both knees.  She is followed by Dr. Hilda LiasKeeling in the orthopedic clinic.  She presents today with worsening right knee pain that has made it difficult for her to walk or sleep.  She denies any new injury or trauma.  She denies any fevers or chills.  The history is provided by the patient.  Knee Pain   This is a chronic problem. The problem occurs constantly. The problem has been gradually worsening. The pain is present in the right knee. The quality of the pain is described as constant. The pain is severe.    Past Medical History:  Diagnosis Date  . Arthritis   . Asthma   . Chronic knee pain   . DVT (deep venous thrombosis) (HCC)   . GERD (gastroesophageal reflux disease)   . Hypertension   . Pulmonary embolism [facility](HCC)     Patient Active Problem List   Diagnosis Date Noted  . Class 2 severe obesity due to excess calories with serious comorbidity and body mass index (BMI) of 37.0 to 37.9 in adult (HCC) 03/25/2017  . Estrogen deficiency 03/25/2017  . HTN, goal below 140/90 12/25/2016  . Mild intermittent asthma without complication 12/23/2016  . Chronic rhinitis 12/23/2016  . Angio-edema 12/23/2016  . Elevated blood sugar 11/19/2016  . Anaphylaxis 11/19/2016  . Syncope   . Pleuritic chest pain 10/05/2016  . Acute pulmonary embolus (HCC) 10/04/2016  . LOWER LEG, ARTHRITIS, DEGEN./OSTEO 03/30/2007  . KNEE PAIN 03/30/2007  . DISC DEGENERATION 03/30/2007  . BACK PAIN 03/30/2007    Past Surgical History:  Procedure Laterality Date  . TUBAL LIGATION       OB History   None      Home Medications    Prior to Admission medications   Medication Sig Start Date End Date  Taking? Authorizing Provider  albuterol (PROVENTIL HFA;VENTOLIN HFA) 108 (90 Base) MCG/ACT inhaler Inhale 2 puffs into the lungs every 6 (six) hours as needed for wheezing or shortness of breath. 10/22/16  Yes Aliene BeamsHagler, Rachel, MD  amLODipine (NORVASC) 5 MG tablet  05/18/17  Yes [provider]  diphenhydrAMINE (BENADRYL) 25 mg capsule Take 25 mg by mouth daily.    Yes [provider]  ELIQUIS 5 MG TABS tablet TAKE 1 TABLET BY MOUTH TWICE DAILY. 06/25/17  Yes Hagler, Fleet Contrasachel, MD  hydrochlorothiazide (HYDRODIURIL) 25 MG tablet Take 1 tablet (25 mg total) by mouth daily. 11/19/16  Yes Aliene BeamsHagler, Rachel, MD  HYDROcodone-acetaminophen (NORCO/VICODIN) 5-325 MG tablet 1 or 2 tabs PO q8 hours prn pain 12/19/16  Yes Samuel JesterMcManus, Kathleen, DO  cetirizine (ZYRTEC) 10 MG tablet Take 1 tablet (10 mg total) by mouth daily. 12/23/16   Alfonse SpruceGallagher, Joel Louis, MD  EPINEPHrine 0.3 mg/0.3 mL IJ SOAJ injection INJECT 0.3 MLS INTO THE MUSCLE ONCE FOR 1 DOSE 12/23/16   [provider]  famotidine (PEPCID) 20 MG tablet Take 1 tablet (20 mg total) by mouth 2 (two) times daily. Patient not taking: Reported on 06/30/2015 09/22/14 06/30/15  Devoria AlbeKnapp, Iva, MD    Family History Family History  Problem Relation Age of Onset  . Hypertension Mother   . Diabetes Mother   .  Dementia Mother   . Arthritis Mother   . Arthritis Father     Social History Social History   Tobacco Use  . Smoking status: Never Smoker  . Smokeless tobacco: Never Used  Substance Use Topics  . Alcohol use: No  . Drug use: No     Allergies   Fish allergy and Tomato   Review of Systems Review of Systems  All other systems reviewed and are negative.    Physical Exam Updated Vital Signs BP 137/83 (BP Location: Left Arm)   Pulse 82   Temp 98.1 F (36.7 C) (Oral)   Resp 16   Ht 5\' 5"  (1.651 m)   Wt 108.9 kg (240 lb)   SpO2 100%   BMI 39.94 kg/m   Physical Exam  Constitutional: She is oriented to person, place, and time.  She appears well-developed and well-nourished. No distress.  HENT:  Head: Normocephalic and atraumatic.  Neck: Normal range of motion. Neck supple.  Pulmonary/Chest: Effort normal.  Musculoskeletal:  The right and left knees appear symmetrical.  There is no obvious effusion.  She has pain with range of motion.  There is no laxity with varus or valgus stress and anterior and posterior drawer tests are negative.  Neurological: She is alert and oriented to person, place, and time.  Skin: She is not diaphoretic.  Nursing note and vitals reviewed.    ED Treatments / Results  Labs (all labs ordered are listed, but only abnormal results are displayed) Labs Reviewed - No data to display  EKG None  Radiology No results found.  Procedures Procedures (including critical care time)  Medications Ordered in ED Medications - No data to display   Initial Impression / Assessment and Plan / ED Course  I have reviewed the triage vital signs and the nursing notes.  Pertinent labs & imaging results that were available during my care of the patient were reviewed by me and considered in my medical decision making (see chart for details).  This is a flareup of chronic osteoarthritis.  She will be given pain medicine and advised to follow-up with her orthopedist next week.  Final Clinical Impressions(s) / ED Diagnoses   Final diagnoses:  None    ED Discharge Orders    None       Geoffery Lyons, MD 06/27/17 [phone]

## 2017-06-27 NOTE — Discharge Instructions (Addendum)
Hydrocodone as prescribed as needed for pain.  Follow-up with your orthopedist next week if not improving.

## 2017-06-27 NOTE — ED Triage Notes (Signed)
Pt reports chronic knee pain, the right one worse over the past week, denies new injury

## 2017-06-27 NOTE — ED Notes (Signed)
ED Provider at bedside. 

## 2017-06-29 MED FILL — Hydrocodone-Acetaminophen Tab 5-325 MG: ORAL | Qty: 6 | Status: AC

## 2017-07-02 ENCOUNTER — Ambulatory Visit: Payer: Medicare Other | Admitting: Family Medicine

## 2017-08-11 ENCOUNTER — Ambulatory Visit: Payer: Medicare Other | Admitting: Orthopaedic Surgery

## 2017-08-11 ENCOUNTER — Encounter: Payer: Self-pay | Admitting: Orthopaedic Surgery

## 2017-08-11 ENCOUNTER — Other Ambulatory Visit: Payer: Self-pay | Admitting: Family Medicine

## 2017-08-11 DIAGNOSIS — I2699 Other pulmonary embolism without acute cor pulmonale: Secondary | ICD-10-CM

## 2017-08-25 ENCOUNTER — Encounter: Payer: Self-pay | Admitting: Orthopaedic Surgery

## 2017-08-25 ENCOUNTER — Ambulatory Visit (INDEPENDENT_AMBULATORY_CARE_PROVIDER_SITE_OTHER): Payer: Medicare Other | Admitting: Orthopaedic Surgery

## 2017-08-25 ENCOUNTER — Ambulatory Visit (INDEPENDENT_AMBULATORY_CARE_PROVIDER_SITE_OTHER): Payer: Medicare Other

## 2017-08-25 VITALS — BP 127/83 | HR 83 | Ht 65.0 in | Wt 228.0 lb

## 2017-08-25 DIAGNOSIS — M25562 Pain in left knee: Secondary | ICD-10-CM

## 2017-08-25 DIAGNOSIS — G8929 Other chronic pain: Secondary | ICD-10-CM

## 2017-08-25 DIAGNOSIS — M25561 Pain in right knee: Secondary | ICD-10-CM

## 2017-08-25 NOTE — Progress Notes (Signed)
Patient NW:GNFAOZH:Cristina Munoz, female DOB:1950-02-12, 67 y.o. YQM:[id number]  Chief Complaint  Patient presents with  . Knee Pain    bilateral     HPI  Cristina Munoz is a 67 y.o. female who has increasing pain in both knees.  She has had injections but they do not last long.  The right knee is worse.  She has no trauma.  She has swelling, popping and giving way.  She has no redness.  She is tired of hurting.  Nothing seems to help them now.   Body mass index is 37.94 kg/m.  ROS  Review of Systems  HENT: Negative for congestion.   Respiratory: Positive for shortness of breath. Negative for cough.   Cardiovascular: Positive for leg swelling. Negative for chest pain.  Endocrine: Positive for cold intolerance.  Musculoskeletal: Positive for arthralgias, gait problem and joint swelling.  Allergic/Immunologic: Positive for environmental allergies.  All other systems reviewed and are negative.   All other systems reviewed and are negative.  Past Medical History:  Diagnosis Date  . Arthritis   . Asthma   . Chronic knee pain   . DVT (deep venous thrombosis) (HCC)   . GERD (gastroesophageal reflux disease)   . Hypertension   . Pulmonary embolism [facility](HCC)     Past Surgical History:  Procedure Laterality Date  . TUBAL LIGATION      Family History  Problem Relation Age of Onset  . Hypertension Mother   . Diabetes Mother   . Dementia Mother   . Arthritis Mother   . Arthritis Father     Social History Social History   Tobacco Use  . Smoking status: Never Smoker  . Smokeless tobacco: Never Used  Substance Use Topics  . Alcohol use: No  . Drug use: No    Allergies  Allergen Reactions  . Fish Allergy Other (See Comments)    Mouth and lips swell  . Tomato Swelling    Current Outpatient Medications  Medication Sig Dispense Refill  . albuterol (PROVENTIL HFA;VENTOLIN HFA) 108 (90 Base) MCG/ACT inhaler Inhale 2 puffs into the lungs every 6 (six) hours as needed for  wheezing or shortness of breath. 1 Inhaler 0  . amLODipine (NORVASC) 5 MG tablet   3  . cetirizine (ZYRTEC) 10 MG tablet Take 1 tablet (10 mg total) by mouth daily. 30 tablet 5  . diphenhydrAMINE (BENADRYL) 25 mg capsule Take 25 mg by mouth daily.     Marland Kitchen. ELIQUIS 5 MG TABS tablet TAKE 1 TABLET BY MOUTH TWICE DAILY. 60 tablet 0  . EPINEPHrine 0.3 mg/0.3 mL IJ SOAJ injection INJECT 0.3 MLS INTO THE MUSCLE ONCE FOR 1 DOSE  2  . hydrochlorothiazide (HYDRODIURIL) 25 MG tablet Take 1 tablet (25 mg total) by mouth daily. 90 tablet 3  . HYDROcodone-acetaminophen (NORCO) 5-325 MG tablet Take 1-2 tablets by mouth every 6 (six) hours as needed. 12 tablet 0  . HYDROcodone-acetaminophen (NORCO) 5-325 MG tablet Take 1-2 tablets by mouth every 6 (six) hours as needed. 12 tablet 0   No current facility-administered medications for this visit.      Physical Exam  Blood pressure 127/83, pulse 83, height 5\' 5"  (1.651 m), weight 228 lb (103.4 kg).  Constitutional: overall normal hygiene, normal nutrition, well developed, normal grooming, normal body habitus. Assistive device:none  Musculoskeletal: gait and station Limp right, muscle tone and strength are normal, no tremors or atrophy is present.  .  Neurological: coordination overall normal.  Deep tendon reflex/nerve stretch  intact.  Sensation normal.  Cranial nerves II-XII intact.   Skin:   Normal overall no scars, lesions, ulcers or rashes. No psoriasis.  Psychiatric: Alert and oriented x 3.  Recent memory intact, remote memory unclear.  Normal mood and affect. Well groomed.  Good eye contact.  Cardiovascular: overall no swelling, no varicosities, no edema bilaterally, normal temperatures of the legs and arms, no clubbing, cyanosis and good capillary refill.  Lymphatic: palpation is normal.  The bilateral lower extremity is examined:  Inspection:  Thigh:  Non-tender and no defects  Knee has swelling 1+ effusion.                        Joint  tenderness is present                        Patient is tender over the medial joint line  Lower Leg:  Has normal appearance and no tenderness or defects  Ankle:  Non-tender and no defects  Foot:  Non-tender and no defects Range of Motion:  Knee:  Range of motion is: 0-100 right, 0 to 105 left                        Crepitus is  present  Ankle:  Range of motion is normal. Strength and Tone:  The bilateral lower extremity has normal strength and tone. Stability:  Knee:  The knee is stable.  Ankle:  The ankle is stable.   All other systems reviewed and are negative   The patient has been educated about the nature of the problem(s) and counseled on treatment options.  The patient appeared to understand what I have discussed and is in agreement with it.  X-rays were done of both knees, reported separately.  Encounter Diagnoses  Name Primary?  . Chronic pain of left knee Yes  . Chronic pain of right knee     PLAN Call if any problems.  Precautions discussed.  Continue current medications.   I spent a good 25 minutes talking to her about her general problem.  She is on a blood thinner and has had multiple episodes of clots, some earlier this year.  The injections to the knees only last a short while, several days.  She is a candidate for a total knee but she will be high risk.  She will have to be done at a larger medical center.  She might not be able to have the knees done until over a year or so after the last clotting episode.  She wants to discuss this with her family.  She needs a new family doctor.  Her doctor is leaving the area.  I have suggested Dr. Dwana Melena.  PROCEDURE NOTE:  The patient requests injections of the left knee , verbal consent was obtained.  The left knee was prepped appropriately after time out was performed.   Sterile technique was observed and injection of 1 cc of Depo-Medrol 40 mg with several cc's of plain xylocaine. Anesthesia was provided by  ethyl chloride and a 20-gauge needle was used to inject the knee area. The injection was tolerated well.  A band aid dressing was applied.  The patient was advised to apply ice later today and tomorrow to the injection sight as needed.  PROCEDURE NOTE:  The patient requests injections of the right knee , verbal consent was obtained.  The right knee was prepped appropriately  after time out was performed.   Sterile technique was observed and injection of 1 cc of Depo-Medrol 40 mg with several cc's of plain xylocaine. Anesthesia was provided by ethyl chloride and a 20-gauge needle was used to inject the knee area. The injection was tolerated well.  A band aid dressing was applied.  The patient was advised to apply ice later today and tomorrow to the injection sight as needed.  She asked for the injections. She said she needs some relief, even if just a few days.    Return to clinic 1 month   Electronically Signed Darreld Mclean, MD 8/6/20193:59 PM

## 2017-09-22 ENCOUNTER — Ambulatory Visit: Payer: Medicare Other | Admitting: Orthopaedic Surgery

## 2017-09-23 ENCOUNTER — Encounter: Payer: Self-pay | Admitting: Orthopaedic Surgery

## 2017-10-05 ENCOUNTER — Other Ambulatory Visit: Payer: Self-pay | Admitting: Family Medicine

## 2017-10-05 DIAGNOSIS — I2699 Other pulmonary embolism without acute cor pulmonale: Secondary | ICD-10-CM

## 2017-10-23 ENCOUNTER — Encounter (HOSPITAL_COMMUNITY): Payer: Self-pay

## 2017-10-23 ENCOUNTER — Other Ambulatory Visit: Payer: Self-pay

## 2017-10-23 ENCOUNTER — Emergency Department (HOSPITAL_COMMUNITY)
Admission: EM | Admit: 2017-10-23 | Discharge: 2017-10-23 | Disposition: A | Discharge status: Home / Self Care | Payer: Medicare Other | Attending: Emergency Medicine | Admitting: Emergency Medicine

## 2017-10-23 ENCOUNTER — Emergency Department (HOSPITAL_COMMUNITY): Payer: Medicare Other

## 2017-10-23 DIAGNOSIS — Z7901 Long term (current) use of anticoagulants: Secondary | ICD-10-CM | POA: Insufficient documentation

## 2017-10-23 DIAGNOSIS — I499 Cardiac arrhythmia, unspecified: Secondary | ICD-10-CM | POA: Diagnosis not present

## 2017-10-23 DIAGNOSIS — R42 Dizziness and giddiness: Secondary | ICD-10-CM | POA: Diagnosis not present

## 2017-10-23 DIAGNOSIS — R0689 Other abnormalities of breathing: Secondary | ICD-10-CM | POA: Diagnosis not present

## 2017-10-23 DIAGNOSIS — I10 Essential (primary) hypertension: Secondary | ICD-10-CM | POA: Insufficient documentation

## 2017-10-23 DIAGNOSIS — R4020 Unspecified coma: Secondary | ICD-10-CM | POA: Diagnosis not present

## 2017-10-23 DIAGNOSIS — Z79899 Other long term (current) drug therapy: Secondary | ICD-10-CM | POA: Insufficient documentation

## 2017-10-23 DIAGNOSIS — E860 Dehydration: Secondary | ICD-10-CM | POA: Diagnosis not present

## 2017-10-23 DIAGNOSIS — R531 Weakness: Secondary | ICD-10-CM | POA: Diagnosis not present

## 2017-10-23 DIAGNOSIS — R55 Syncope and collapse: Secondary | ICD-10-CM | POA: Diagnosis not present

## 2017-10-23 DIAGNOSIS — R609 Edema, unspecified: Secondary | ICD-10-CM | POA: Diagnosis not present

## 2017-10-23 DIAGNOSIS — J45909 Unspecified asthma, uncomplicated: Secondary | ICD-10-CM | POA: Insufficient documentation

## 2017-10-23 DIAGNOSIS — R404 Transient alteration of awareness: Secondary | ICD-10-CM | POA: Diagnosis not present

## 2017-10-23 LAB — CBC
HCT: 33.9 % — ABNORMAL LOW (ref 36.0–46.0)
Hemoglobin: 11.1 g/dL — ABNORMAL LOW (ref 12.0–15.0)
MCH: 28.8 pg (ref 26.0–34.0)
MCHC: 32.7 g/dL (ref 30.0–36.0)
MCV: 87.8 fL (ref 78.0–100.0)
Platelets: 211 10*3/uL (ref 150–400)
RBC: 3.86 MIL/uL — ABNORMAL LOW (ref 3.87–5.11)
RDW: 15 % (ref 11.5–15.5)
WBC: 5.6 10*3/uL (ref 4.0–10.5)

## 2017-10-23 LAB — HEPATIC FUNCTION PANEL
ALK PHOS: 64 U/L (ref 38–126)
ALT: 11 U/L (ref 0–44)
AST: 15 U/L (ref 15–41)
Albumin: 3.6 g/dL (ref 3.5–5.0)
BILIRUBIN TOTAL: 0.4 mg/dL (ref 0.3–1.2)
Bilirubin, Direct: <0.1 mg/dL (ref 0.0–0.2)
Total Protein: 7.1 g/dL (ref 6.5–8.1)

## 2017-10-23 LAB — URINALYSIS, ROUTINE W REFLEX MICROSCOPIC
Bilirubin Urine: NEGATIVE
GLUCOSE, UA: NEGATIVE mg/dL
KETONES UR: 5 mg/dL — AB
Nitrite: NEGATIVE
PH: 6 (ref 5.0–8.0)
PROTEIN: NEGATIVE mg/dL
Specific Gravity, Urine: 1.015 (ref 1.005–1.030)

## 2017-10-23 LAB — DIFFERENTIAL
Basophils Absolute: 0 10*3/uL (ref 0.0–0.1)
Basophils Relative: 0 %
EOS ABS: 0.1 10*3/uL (ref 0.0–0.7)
EOS PCT: 1 %
Lymphocytes Relative: 34 %
Lymphs Abs: 1.9 10*3/uL (ref 0.7–4.0)
MONO ABS: 0.3 10*3/uL (ref 0.1–1.0)
Monocytes Relative: 5 %
NEUTROS PCT: 60 %
Neutro Abs: 3.3 10*3/uL (ref 1.7–7.7)

## 2017-10-23 LAB — BASIC METABOLIC PANEL
ANION GAP: 8 (ref 5–15)
BUN: 20 mg/dL (ref 8–23)
CHLORIDE: 104 mmol/L (ref 98–111)
CO2: 29 mmol/L (ref 22–32)
Calcium: 9.1 mg/dL (ref 8.9–10.3)
Creatinine, Ser: 0.99 mg/dL (ref 0.44–1.00)
GFR calc Af Amer: >60 mL/min (ref >60)
GFR calc non Af Amer: 57 mL/min — ABNORMAL LOW (ref >60)
Glucose, Bld: 119 mg/dL — ABNORMAL HIGH (ref 70–99)
POTASSIUM: 3.6 mmol/L (ref 3.5–5.1)
SODIUM: 141 mmol/L (ref 135–145)

## 2017-10-23 LAB — CBG MONITORING, ED: Glucose-Capillary: 95 mg/dL (ref 70–99)

## 2017-10-23 LAB — TROPONIN I: Troponin I: <0.03 ng/mL (ref <0.03)

## 2017-10-23 MED ORDER — SODIUM CHLORIDE 0.9 % IV BOLUS (SEPSIS)
1000.0000 mL | Freq: Once | INTRAVENOUS | Status: AC
  Administered 2017-10-23: 1000 mL via INTRAVENOUS

## 2017-10-23 NOTE — Discharge Instructions (Addendum)
Stop taking your hydrochlorothiazide and follow-up with the family doctor within a week.  Drink plenty of fluids.  Return to the emergency department if any problems

## 2017-10-23 NOTE — ED Provider Notes (Signed)
[facility] EMERGENCY DEPARTMENT Provider Note   CSN: [id number] Arrival date & time: 10/23/17  4098     History   Chief Complaint Chief Complaint  Patient presents with  . Loss of Consciousness    HPI Cristina Munoz is a 67 y.o. female.  Patient states she was seated in a chair and felt dizzy and passed out.  According to her daughter she was unresponsive for for almost 30 minutes.  When paramedics arrived she was slightly arousable when she got to the emergency department she was back to normal  The history is provided by the patient. No language interpreter was used.  Loss of Consciousness   This is a new problem. The current episode started less than 1 hour ago. The problem occurs rarely. The problem has been resolved. She lost consciousness for a period of greater than 5 minutes. The problem is associated with normal activity. Associated symptoms include dizziness and visual change. Pertinent negatives include abdominal pain, back pain, chest pain, congestion, headaches and seizures. She has tried nothing for the symptoms. Her past medical history does not include CVA.    Past Medical History:  Diagnosis Date  . Arthritis   . Asthma   . Chronic knee pain   . DVT (deep venous thrombosis) (HCC)   . GERD (gastroesophageal reflux disease)   . Hypertension   . Pulmonary embolism [facility])     Patient Active Problem List   Diagnosis Date Noted  . Class 2 severe obesity due to excess calories with serious comorbidity and body mass index (BMI) of 37.0 to 37.9 in adult (HCC) 03/25/2017  . Estrogen deficiency 03/25/2017  . HTN, goal below 140/90 12/25/2016  . Mild intermittent asthma without complication 12/23/2016  . Chronic rhinitis 12/23/2016  . Angio-edema 12/23/2016  . Elevated blood sugar 11/19/2016  . Anaphylaxis 11/19/2016  . Syncope   . Pleuritic chest pain 10/05/2016  . Acute pulmonary embolus (HCC) 10/04/2016  . LOWER LEG, ARTHRITIS, DEGEN./OSTEO 03/30/2007  .  KNEE PAIN 03/30/2007  . DISC DEGENERATION 03/30/2007  . BACK PAIN 03/30/2007    Past Surgical History:  Procedure Laterality Date  . TUBAL LIGATION       OB History   None      Home Medications    Prior to Admission medications   Medication Sig Start Date End Date Taking? Authorizing Provider  acetaminophen (TYLENOL) 650 MG CR tablet Take 650 mg by mouth 2 (two) times daily as needed for pain.   Yes [provider]  albuterol (PROVENTIL HFA;VENTOLIN HFA) 108 (90 Base) MCG/ACT inhaler Inhale 2 puffs into the lungs every 6 (six) hours as needed for wheezing or shortness of breath. 10/22/16  Yes Hagler, Fleet Contras, MD  amLODipine (NORVASC) 5 MG tablet Take 5 mg by mouth daily.  05/18/17  Yes [provider]  cetirizine (ZYRTEC) 10 MG tablet Take 1 tablet (10 mg total) by mouth daily. Patient taking differently: Take 10 mg by mouth daily as needed for allergies.  12/23/16  Yes Alfonse Spruce, MD  ELIQUIS 5 MG TABS tablet TAKE 1 TABLET BY MOUTH TWICE DAILY. Patient taking differently: Take 5 mg by mouth 2 (two) times daily.  10/05/17  Yes Hagler, Fleet Contras, MD  EPINEPHrine 0.3 mg/0.3 mL IJ SOAJ injection Inject 0.3 mg into the muscle once.  12/23/16  Yes [provider]  fluticasone (FLONASE) 50 MCG/ACT nasal spray Place 1 spray into both nostrils daily as needed for allergies or rhinitis.   Yes [provider]  hydrochlorothiazide (HYDRODIURIL) 25 MG tablet Take 1 tablet (25 mg total) by mouth daily. 11/19/16  Yes Hagler, Fleet Contras, MD  HYDROcodone-acetaminophen (NORCO) 5-325 MG tablet Take 1-2 tablets by mouth every 6 (six) hours as needed. Patient not taking: Reported on 10/23/2017 06/27/17   Geoffery Lyons, MD    Family History Family History  Problem Relation Age of Onset  . Hypertension Mother   . Diabetes Mother   . Dementia Mother   . Arthritis Mother   . Arthritis Father     Social History Social History   Tobacco Use  . Smoking status:  Never Smoker  . Smokeless tobacco: Never Used  Substance Use Topics  . Alcohol use: No  . Drug use: No     Allergies   Fish allergy and Tomato   Review of Systems Review of Systems  Constitutional: Negative for appetite change and fatigue.  HENT: Negative for congestion, ear discharge and sinus pressure.   Eyes: Negative for discharge.  Respiratory: Negative for cough.   Cardiovascular: Positive for syncope. Negative for chest pain.  Gastrointestinal: Negative for abdominal pain and diarrhea.  Genitourinary: Negative for frequency and hematuria.  Musculoskeletal: Negative for back pain.  Skin: Negative for rash.  Neurological: Positive for dizziness. Negative for seizures and headaches.  Psychiatric/Behavioral: Negative for hallucinations.     Physical Exam Updated Vital Signs BP 111/61   Pulse (!) 58   Temp (!) 97.3 F (36.3 C) (Oral)   Resp (!) 22   Ht 5\' 6"  (1.676 m)   Wt 102.1 kg   SpO2 98%   BMI 36.32 kg/m   Physical Exam  Constitutional: She is oriented to person, place, and time. She appears well-developed.  HENT:  Head: Normocephalic.  Eyes: Conjunctivae and EOM are normal. No scleral icterus.  Neck: Neck supple. No thyromegaly present.  Cardiovascular: Normal rate and regular rhythm. Exam reveals no gallop and no friction rub.  No murmur heard. Pulmonary/Chest: No stridor. She has no wheezes. She has no rales. She exhibits no tenderness.  Abdominal: She exhibits no distension. There is no tenderness. There is no rebound.  Musculoskeletal: Normal range of motion. She exhibits no edema.  Lymphadenopathy:    She has no cervical adenopathy.  Neurological: She is oriented to person, place, and time. She exhibits normal muscle tone. Coordination normal.  Skin: No rash noted. No erythema.  Psychiatric: She has a normal mood and affect. Her behavior is normal.     ED Treatments / Results  Labs (all labs ordered are listed, but only abnormal results are  displayed) Labs Reviewed  BASIC METABOLIC PANEL - Abnormal; Notable for the following components:      Result Value   Glucose, Bld 119 (*)    GFR calc non Af Amer 57 (*)    All other components within normal limits  CBC - Abnormal; Notable for the following components:   RBC 3.86 (*)    Hemoglobin 11.1 (*)    HCT 33.9 (*)    All other components within normal limits  URINALYSIS, ROUTINE W REFLEX MICROSCOPIC - Abnormal; Notable for the following components:   APPearance HAZY (*)    Hgb urine dipstick SMALL (*)    Ketones, ur 5 (*)    Leukocytes, UA SMALL (*)    Bacteria, UA FEW (*)    All other components within normal limits  TROPONIN I  HEPATIC FUNCTION PANEL  DIFFERENTIAL  CBG MONITORING, ED    EKG EKG Interpretation  Date/Time:  Friday October 23 2017 16:10:96 EDT Ventricular Rate:  62 PR Interval:    QRS Duration: 98 QT Interval:  412 QTC Calculation: 419 R Axis:   -16 Text Interpretation:  Sinus rhythm Probable left ventricular hypertrophy No significant change was found Confirmed by Glynn Octave [phone]) on 10/23/2017 6:51:38 AM   Radiology Ct Head Wo Contrast  Result Date: 10/23/2017 CLINICAL DATA:  Found unresponsive at home.  Generalized weakness. EXAM: CT HEAD WITHOUT CONTRAST TECHNIQUE: Contiguous axial images were obtained from the base of the skull through the vertex without intravenous contrast. COMPARISON:  None. FINDINGS: Brain: The brain shows a normal appearance without evidence of malformation, atrophy, old or acute small or large vessel infarction, mass lesion, hemorrhage, hydrocephalus or extra-axial collection. Vascular: No hyperdense vessel. No evidence of atherosclerotic calcification. Skull: Normal.  No traumatic finding.  No focal bone lesion. Sinuses/Orbits: Sinuses are clear. Orbits appear normal. Mastoids are clear. Other: None significant IMPRESSION: Normal head CT Electronically Signed   By: Paulina Fusi M.D.   On: 10/23/2017 09:18     Procedures Procedures (including critical care time)  Medications Ordered in ED Medications  sodium chloride 0.9 % bolus 1,000 mL (0 mLs Intravenous Stopped 10/23/17 0854)  sodium chloride 0.9 % bolus 1,000 mL (0 mLs Intravenous Stopped 10/23/17 1119)     Initial Impression / Assessment and Plan / ED Course  I have reviewed the triage vital signs and the nursing notes.  Pertinent labs & imaging results that were available during my care of the patient were reviewed by me and considered in my medical decision making (see chart for details).     Labs including CBC chemistries EKG CT head unremarkable.  Patient was orthostatic and improved some with IV fluids.  I discussed possible observation admission for syncope with the hospitalist and we decided to stop her HCTZ and have her follow-up with her PCP or return if problems.    Final Clinical Impressions(s) / ED Diagnoses   Final diagnoses:  Syncope and collapse  Dehydration    ED Discharge Orders    None       Bethann Berkshire, MD 10/23/17 1242

## 2017-10-23 NOTE — ED Triage Notes (Signed)
Pt arrives via [facility], was unresponsive at home, was awake on ems arrival, c/o generalized weakness, denies pain

## 2017-10-23 NOTE — ED Notes (Signed)
EDP at bedside updating patient and family. 

## 2017-11-25 ENCOUNTER — Ambulatory Visit (INDEPENDENT_AMBULATORY_CARE_PROVIDER_SITE_OTHER): Payer: Medicare Other

## 2017-11-25 ENCOUNTER — Encounter: Payer: Self-pay | Admitting: Orthopaedic Surgery

## 2017-11-25 ENCOUNTER — Ambulatory Visit (INDEPENDENT_AMBULATORY_CARE_PROVIDER_SITE_OTHER): Payer: Medicare Other | Admitting: Orthopaedic Surgery

## 2017-11-25 VITALS — BP 149/86 | HR 63 | Ht 67.0 in

## 2017-11-25 DIAGNOSIS — M25571 Pain in right ankle and joints of right foot: Secondary | ICD-10-CM

## 2017-11-25 MED ORDER — PREDNISONE 5 MG (21) PO TBPK: ORAL_TABLET | ORAL | 0 refills | Status: AC

## 2017-11-25 NOTE — Patient Instructions (Signed)
  Physical therapy has been ordered for you at [facility] [phone] is the phone number to call if you want to call to schedule. Please let us know if you do not hear anything within one week.  

## 2017-11-25 NOTE — Progress Notes (Signed)
Patient ZO:XWRUEAV Cristina Munoz, female DOB:Nov 23, 1950, 67 y.o. WUJ:[id number]  Chief Complaint  Patient presents with  . Foot Pain    Right foot 3-4 weeks. No known injuries.    HPI  Cristina Munoz is a 67 y.o. female who has developed pain of the posterior right ankle and heel area.  This has happened over the last month or so.  She denies any trauma, any fall, any untoward activity.  She has chronic swelling of both feet and ankles and has special shoes she wears.  She has no redness.  She has no increased heat or warmth.  She has tried elevation and heat with no help.  She has no numbness.  She is on a blood thinner and cannot take NSAIDs.     Body mass index is 35.24 kg/m.  ROS  Review of Systems  HENT: Negative for congestion.   Respiratory: Positive for shortness of breath. Negative for cough.   Cardiovascular: Positive for leg swelling. Negative for chest pain.  Endocrine: Positive for cold intolerance.  Musculoskeletal: Positive for arthralgias, gait problem and joint swelling.  Allergic/Immunologic: Positive for environmental allergies.  All other systems reviewed and are negative.   All other systems reviewed and are negative.  The following is a summary of the past history medically, past history surgically, known current medicines, social history and family history.  This information is gathered electronically by the computer from prior information and documentation.  I review this each visit and have found including this information at this point in the chart is beneficial and informative.    Past Medical History:  Diagnosis Date  . Arthritis   . Asthma   . Chronic knee pain   . DVT (deep venous thrombosis) (HCC)   . GERD (gastroesophageal reflux disease)   . Hypertension   . Pulmonary embolism [facility])     Past Surgical History:  Procedure Laterality Date  . TUBAL LIGATION      Family History  Problem Relation Age of Onset  . Hypertension Mother   . Diabetes  Mother   . Dementia Mother   . Arthritis Mother   . Arthritis Father     Social History Social History   Tobacco Use  . Smoking status: Never Smoker  . Smokeless tobacco: Never Used  Substance Use Topics  . Alcohol use: No  . Drug use: No    Allergies  Allergen Reactions  . Fish Allergy Other (See Comments)    Mouth and lips swell  . Tomato Swelling    Current Outpatient Medications  Medication Sig Dispense Refill  . acetaminophen (TYLENOL) 650 MG CR tablet Take 650 mg by mouth 2 (two) times daily as needed for pain.    Marland Kitchen albuterol (PROVENTIL HFA;VENTOLIN HFA) 108 (90 Base) MCG/ACT inhaler Inhale 2 puffs into the lungs every 6 (six) hours as needed for wheezing or shortness of breath. 1 Inhaler 0  . amLODipine (NORVASC) 5 MG tablet Take 5 mg by mouth daily.   3  . cetirizine (ZYRTEC) 10 MG tablet Take 1 tablet (10 mg total) by mouth daily. (Patient taking differently: Take 10 mg by mouth daily as needed for allergies. ) 30 tablet 5  . ELIQUIS 5 MG TABS tablet TAKE 1 TABLET BY MOUTH TWICE DAILY. (Patient taking differently: Take 5 mg by mouth 2 (two) times daily. ) 60 tablet 2  . EPINEPHrine 0.3 mg/0.3 mL IJ SOAJ injection Inject 0.3 mg into the muscle once.   2  . fluticasone (  FLONASE) 50 MCG/ACT nasal spray Place 1 spray into both nostrils daily as needed for allergies or rhinitis.    . hydrochlorothiazide (HYDRODIURIL) 25 MG tablet Take 1 tablet (25 mg total) by mouth daily. 90 tablet 3  . HYDROcodone-acetaminophen (NORCO) 5-325 MG tablet Take 1-2 tablets by mouth every 6 (six) hours as needed. (Patient not taking: Reported on 10/23/2017) 12 tablet 0   No current facility-administered medications for this visit.      Physical Exam  Blood pressure (!) 149/86, pulse 63, height 5\' 7"  (1.702 m).  Constitutional: overall normal hygiene, normal nutrition, well developed, normal grooming, normal body habitus. Assistive device:none  Musculoskeletal: gait and station Limp  right, muscle tone and strength are normal, no tremors or atrophy is present.  .  Neurological: coordination overall normal.  Deep tendon reflex/nerve stretch intact.  Sensation normal.  Cranial nerves II-XII intact.   Skin:   Normal overall no scars, lesions, ulcers or rashes. No psoriasis.  Psychiatric: Alert and oriented x 3.  Recent memory intact, remote memory unclear.  Normal mood and affect. Well groomed.  Good eye contact.  Cardiovascular: overall bilateral swelling, no varicosities, she does have edema bilaterally, normal temperatures of the legs and arms, no clubbing, cyanosis and good capillary refill.  Lymphatic: palpation is normal.  Right ankle has swelling but so does the left. ROM of the ankle is full.  She has some tenderness of the heel on the posterior ankle but not on the plantar foot area.  Achilles is intact.  NV is intact.  Limp to the right.  All other systems reviewed and are negative   The patient has been educated about the nature of the problem(s) and counseled on treatment options.  The patient appeared to understand what I have discussed and is in agreement with it.  Encounter Diagnosis  Name Primary?  . Pain in joint involving right ankle and foot Yes   X-rays were done of the right ankle, reported separately.  PLAN Call if any problems.  Precautions discussed.  Continue current medications. I will begin prednisone dose pack. Return to clinic 2 weeks   Begin PT for the ankle.  Use her walker.  Electronically Signed Darreld Mclean, MD 11/6/20192:52 PM

## 2017-12-09 ENCOUNTER — Ambulatory Visit: Payer: Medicare Other | Admitting: Orthopaedic Surgery

## 2017-12-11 ENCOUNTER — Other Ambulatory Visit: Payer: Self-pay | Admitting: Family Medicine

## 2017-12-11 DIAGNOSIS — I10 Essential (primary) hypertension: Secondary | ICD-10-CM

## 2018-04-22 ENCOUNTER — Other Ambulatory Visit: Payer: Self-pay | Admitting: Cardiovascular Disease

## 2018-04-23 DIAGNOSIS — I82401 Acute embolism and thrombosis of unspecified deep veins of right lower extremity: Secondary | ICD-10-CM | POA: Diagnosis not present

## 2018-04-23 DIAGNOSIS — I82402 Acute embolism and thrombosis of unspecified deep veins of left lower extremity: Secondary | ICD-10-CM | POA: Diagnosis not present

## 2018-10-01 IMAGING — CT CT ANGIO CHEST
2 of 6 series · 18 of 46 positions shown · IV contrast (Isovue)
Comparison: 12/20/2011

CLINICAL DATA: 65-year-old female with a history of left-sided
abdominal rib pain.

EXAM:
CT ANGIOGRAPHY CHEST WITH CONTRAST
TECHNIQUE: Multidetector CT imaging of the chest was performed using the
standard protocol during bolus administration of intravenous
contrast. Multiplanar CT image reconstructions and MIPs were
obtained to evaluate the vascular anatomy.
CONTRAST:  100 cc Isovue 370

[Series 5: thins · axial · 0.69mm/px · z∈[+1214,+1459]mm · 15 of 269 slices shown]
[im 12/269  lung]
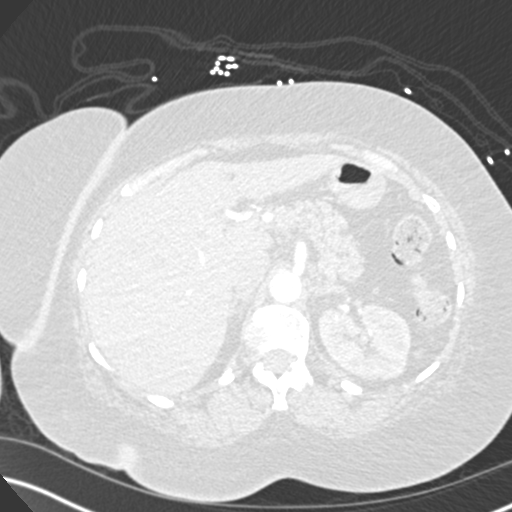
[im 35/269  soft-tissue]
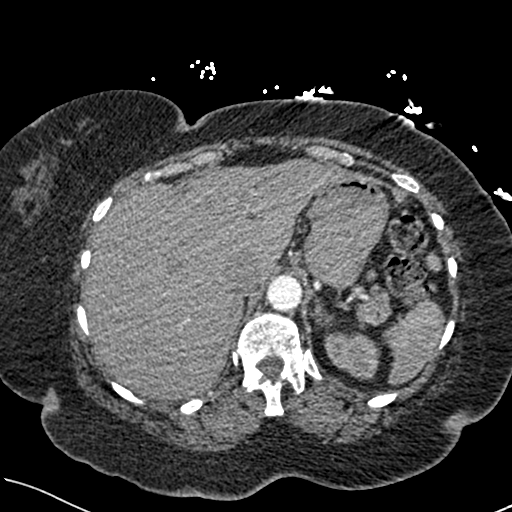
[im 47/269  lung]
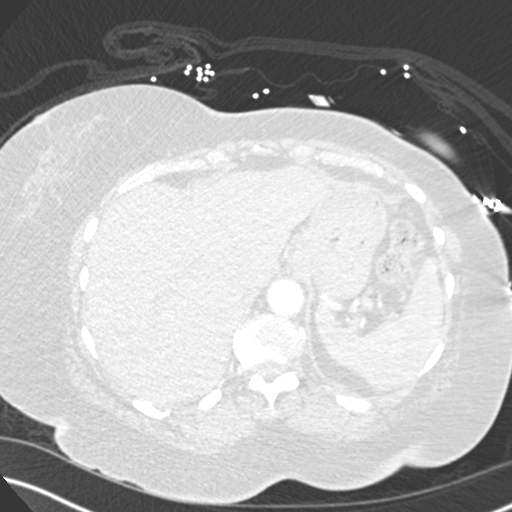
[im 70/269  soft-tissue]
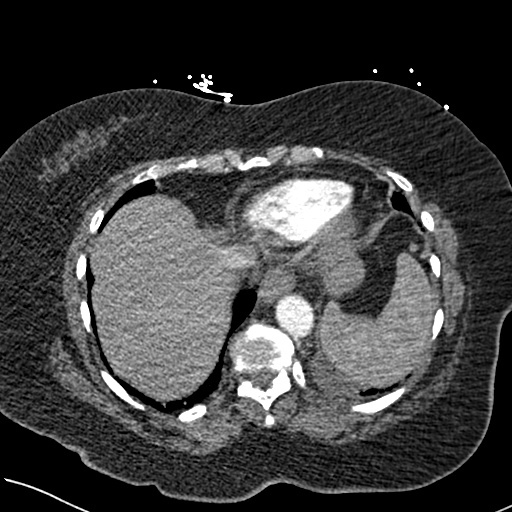
[im 82/269  lung]
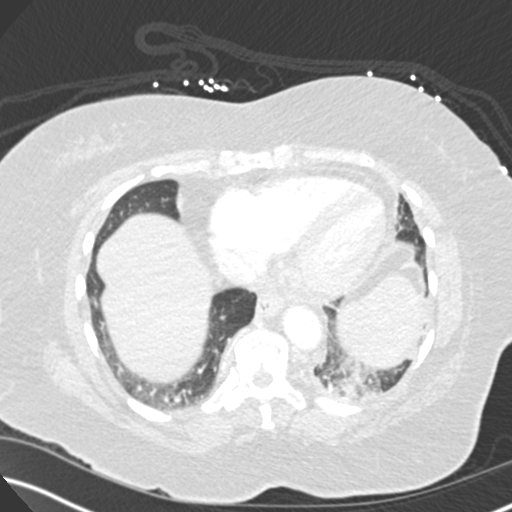
[im 105/269  soft-tissue]
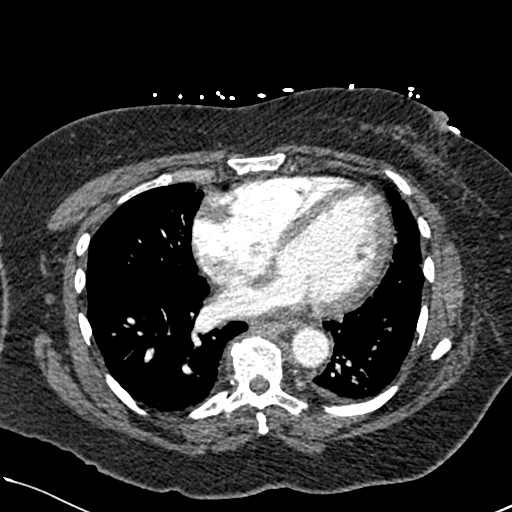
[im 117/269  lung]
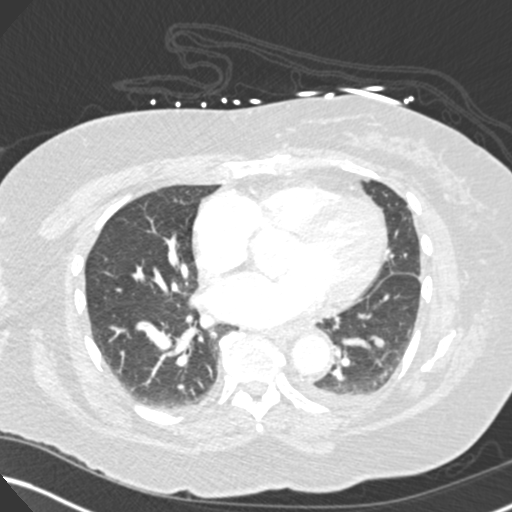
[im 140/269  soft-tissue]
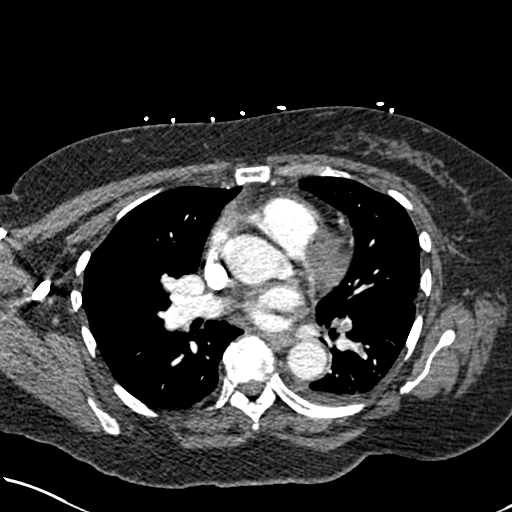
[im 152/269  lung]
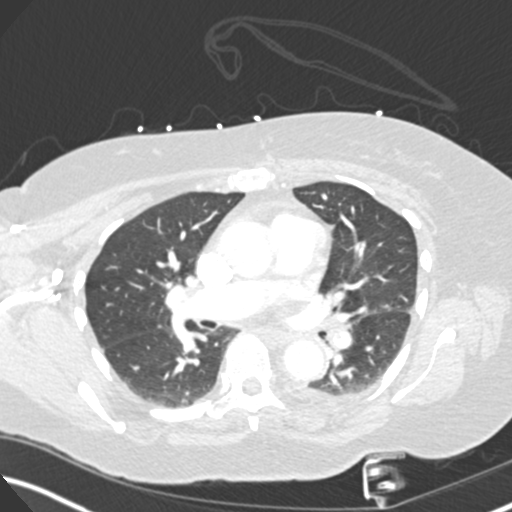
[im 164/269  soft-tissue]
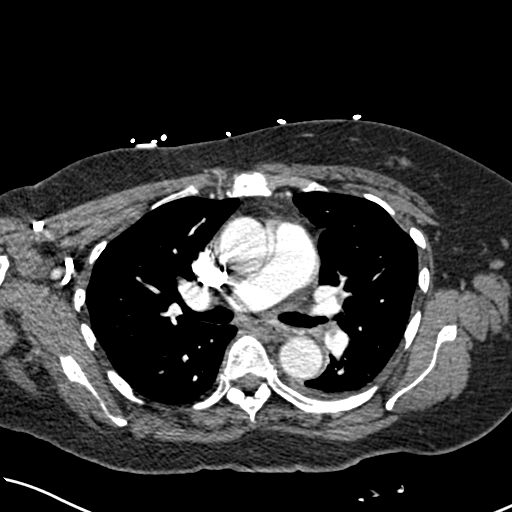
[im 187/269  lung]
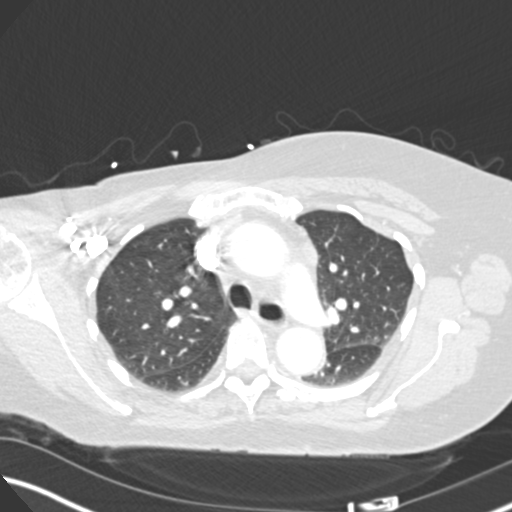
[im 199/269  soft-tissue]
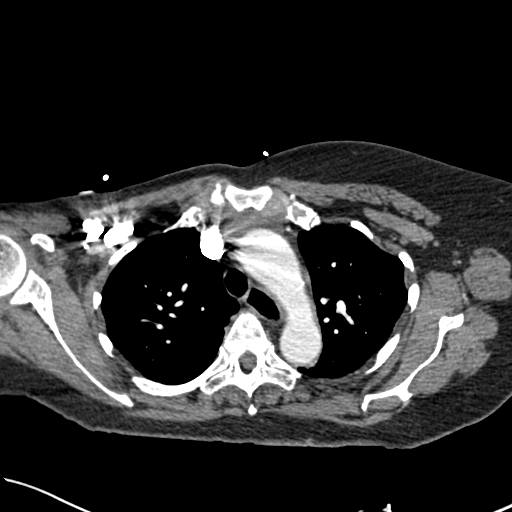
[im 222/269  lung]
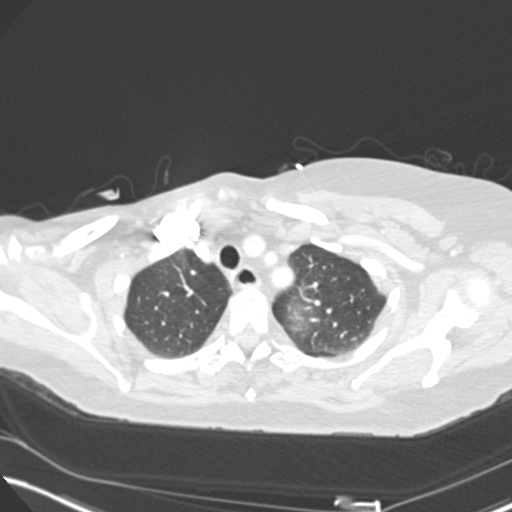
[im 234/269  soft-tissue]
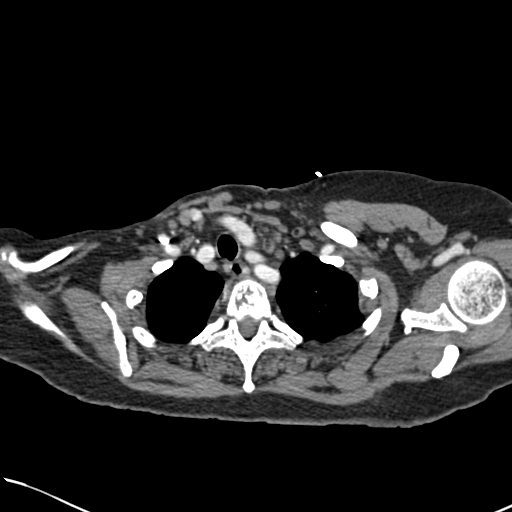
[im 257/269  lung]
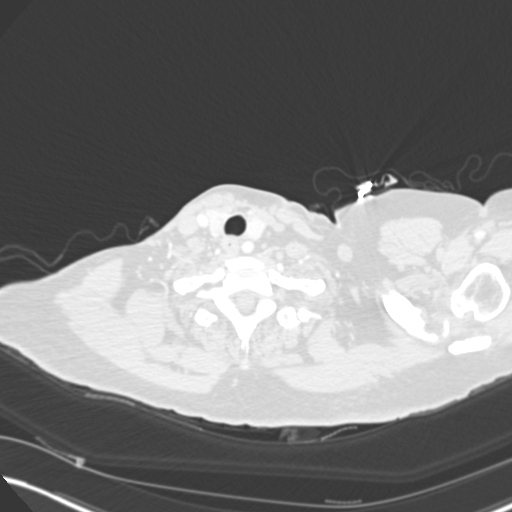

[Series 7: coronal mpr · coronal · 0.51mm/px · 3 of 127 slices shown]
[im 32/127  soft-tissue]
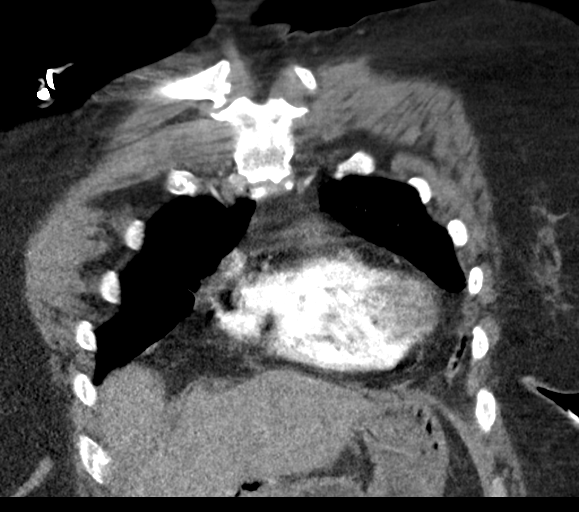
[im 64/127  soft-tissue]
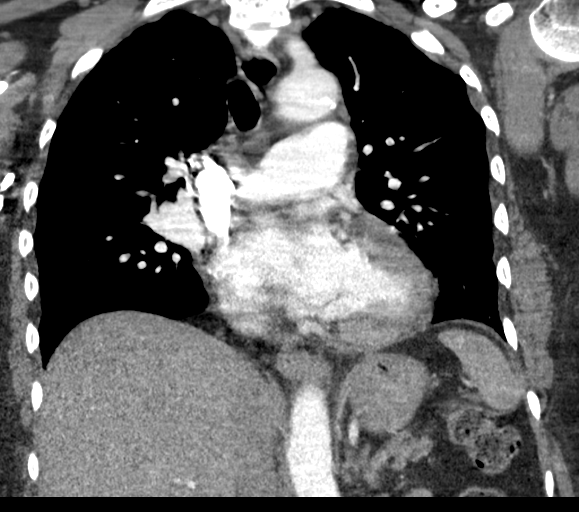
[im 95/127  soft-tissue]
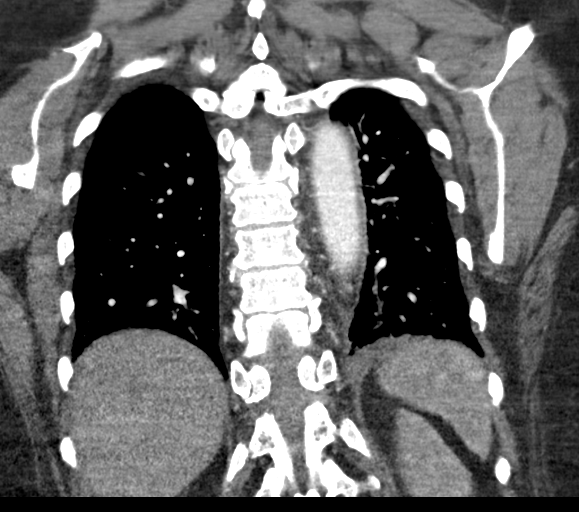

[18 of 46 positions shown; findings below may reference images not displayed]

FINDINGS: Cardiovascular:

Heart:

No cardiomegaly. No pericardial fluid/thickening. No significant
coronary calcifications. Diameter of right ventricle to left
ventricle measures less than 1. No inversion of the interventricular
septum. No enlarged right ventricle. No reflux of contrast into the
hepatic IVC.

Aorta:

Unremarkable course, caliber, contour of the thoracic aorta. No
aneurysm or dissection flap. No periaortic fluid. Minimal
calcifications of the aortic arch. Branch vessels remain patent.

Pulmonary arteries:

Filling defects of left-sided segmental, subsegmental vessels of the
left upper lobe and right upper lobe.

No main pulmonary artery filling defect. No right lobar filling
defects. No right-sided segmental or subsegmental emboli identified.
Trace left pleural effusion.

Mediastinum/Nodes: No mediastinal adenopathy.

Unremarkable appearance of the thoracic inlet and thyroid.

Lungs/Pleura: Mixed ground-glass and nodular opacity of the left
lower lobe, an the distribution related to pulmonary emboli. Small
pleural effusion.

No pneumothorax.

Upper Abdomen: Unremarkable.

Musculoskeletal: No acute displaced fracture.

There are small lucent lesions within the vertebral bodies of T2 and
T3. Lucent lesion of the spinous process of T5. No fracture
identified.

Review of the MIP images confirms the above findings.
IMPRESSION: Study is positive for left-sided pulmonary embolism involving
segmental and subsegmental vessels.

No evidence of right-sided heart strain.

Mixed in linear and nodular changes of the left lower lobe,
compatible with consolidation in the distribution of affected
pulmonary vessels. Trace reactive left pleural effusion.

Small lucent lesions within the vertebral body of T2 and T3, and the
posterior elements of T5. These are nonspecific, though could
represent lytic lesions in the setting of multiple on myeloma or
other metastatic disease. Correlation with lab values and
potentially bone scan may be useful if there is concern for
malignancy as a risk factors for the patient's pulmonary embolism.

Results called by telephone at the time of interpretation on
10/04/2016 at [DATE] to Dr. NIVIRUS DATABEX , who verbally acknowledged
these results.

## 2018-10-08 ENCOUNTER — Other Ambulatory Visit (HOSPITAL_COMMUNITY): Payer: Self-pay | Admitting: Internal Medicine

## 2018-10-08 DIAGNOSIS — I10 Essential (primary) hypertension: Secondary | ICD-10-CM | POA: Diagnosis not present

## 2018-10-08 DIAGNOSIS — Z23 Encounter for immunization: Secondary | ICD-10-CM | POA: Diagnosis not present

## 2018-10-08 DIAGNOSIS — I82501 Chronic embolism and thrombosis of unspecified deep veins of right lower extremity: Secondary | ICD-10-CM | POA: Diagnosis not present

## 2018-10-08 DIAGNOSIS — D6859 Other primary thrombophilia: Secondary | ICD-10-CM | POA: Diagnosis not present

## 2018-10-08 DIAGNOSIS — Z1231 Encounter for screening mammogram for malignant neoplasm of breast: Secondary | ICD-10-CM

## 2018-10-11 ENCOUNTER — Other Ambulatory Visit (HOSPITAL_COMMUNITY): Payer: Self-pay | Admitting: Internal Medicine

## 2018-10-11 DIAGNOSIS — Z780 Asymptomatic menopausal state: Secondary | ICD-10-CM

## 2018-10-17 ENCOUNTER — Emergency Department (HOSPITAL_COMMUNITY)
Admission: EM | Admit: 2018-10-17 | Discharge: 2018-10-17 | Disposition: A | Discharge status: Home / Self Care | Payer: Medicare Other | Attending: Emergency Medicine | Admitting: Emergency Medicine

## 2018-10-17 ENCOUNTER — Encounter (HOSPITAL_COMMUNITY): Payer: Self-pay | Admitting: Emergency Medicine

## 2018-10-17 ENCOUNTER — Other Ambulatory Visit: Payer: Self-pay

## 2018-10-17 ENCOUNTER — Emergency Department (HOSPITAL_COMMUNITY): Payer: Medicare Other

## 2018-10-17 DIAGNOSIS — Z7901 Long term (current) use of anticoagulants: Secondary | ICD-10-CM | POA: Diagnosis not present

## 2018-10-17 DIAGNOSIS — M25561 Pain in right knee: Secondary | ICD-10-CM | POA: Insufficient documentation

## 2018-10-17 DIAGNOSIS — Z79899 Other long term (current) drug therapy: Secondary | ICD-10-CM | POA: Insufficient documentation

## 2018-10-17 DIAGNOSIS — I10 Essential (primary) hypertension: Secondary | ICD-10-CM | POA: Diagnosis not present

## 2018-10-17 DIAGNOSIS — M79604 Pain in right leg: Secondary | ICD-10-CM

## 2018-10-17 MED ORDER — OXYCODONE-ACETAMINOPHEN 5-325 MG PO TABS
1.0000 | ORAL_TABLET | Freq: Once | ORAL | Status: AC
  Administered 2018-10-17: 1 via ORAL
  Filled 2018-10-17: qty 1

## 2018-10-17 MED ORDER — HYDROCODONE-ACETAMINOPHEN 5-325 MG PO TABS: 1.0000 | ORAL_TABLET | Freq: Four times a day (QID) | ORAL | 0 refills | Status: DC | PRN

## 2018-10-17 NOTE — ED Triage Notes (Signed)
Pt with c/o R knee pain x 3-4 days. Denies injury but states she has a hx of "blood clots" and was worried.

## 2018-10-17 NOTE — ED Provider Notes (Signed)
[facility] EMERGENCY DEPARTMENT Provider Note   CSN: [id number] Arrival date & time: 10/17/18  0041     History   Chief Complaint Chief Complaint  Patient presents with  . Knee Pain    HPI Cristina Munoz is a 68 y.o. female.     HPI  This is a 68 year old female with a history of arthritis, DVT, hypertension who presents with right knee pain.  Patient reports acute onset of right knee pain 2 to 3 days ago.  Pain is worse with ambulation.  Patient reports that she is concerned she may have a blood clot.  She had been taking Eliquis but ran out of her prescription.  She reinitiated Eliquis on Friday after a short few days of not being able to take it.  She is not noted any numbness or tingling in the leg.  She rates her pain at 10 out of 10.  She is not noted any skin changes or fevers.  Past Medical History:  Diagnosis Date  . Arthritis   . Asthma   . Chronic knee pain   . DVT (deep venous thrombosis) (HCC)   . GERD (gastroesophageal reflux disease)   . Hypertension   . Pulmonary embolism [facility])     Patient Active Problem List   Diagnosis Date Noted  . Class 2 severe obesity due to excess calories with serious comorbidity and body mass index (BMI) of 37.0 to 37.9 in adult (HCC) 03/25/2017  . Estrogen deficiency 03/25/2017  . HTN, goal below 140/90 12/25/2016  . Mild intermittent asthma without complication 12/23/2016  . Chronic rhinitis 12/23/2016  . Angio-edema 12/23/2016  . Elevated blood sugar 11/19/2016  . Anaphylaxis 11/19/2016  . Syncope   . Pleuritic chest pain 10/05/2016  . Acute pulmonary embolus (HCC) 10/04/2016  . LOWER LEG, ARTHRITIS, DEGEN./OSTEO 03/30/2007  . KNEE PAIN 03/30/2007  . DISC DEGENERATION 03/30/2007  . BACK PAIN 03/30/2007    Past Surgical History:  Procedure Laterality Date  . TUBAL LIGATION       OB History   No obstetric history on file.      Home Medications    Prior to Admission medications   Medication Sig Start Date  End Date Taking? Authorizing Provider  acetaminophen (TYLENOL) 650 MG CR tablet Take 650 mg by mouth 2 (two) times daily as needed for pain.   Yes [provider]  albuterol (PROVENTIL HFA;VENTOLIN HFA) 108 (90 Base) MCG/ACT inhaler Inhale 2 puffs into the lungs every 6 (six) hours as needed for wheezing or shortness of breath. 10/22/16  Yes Hagler, Fleet Contras, MD  amLODipine (NORVASC) 5 MG tablet TAKE 1 TABLET(5 MG) BY MOUTH DAILY 04/23/18  Yes Laqueta Linden, MD  cetirizine (ZYRTEC) 10 MG tablet Take 1 tablet (10 mg total) by mouth daily. Patient taking differently: Take 10 mg by mouth daily as needed for allergies.  12/23/16  Yes Alfonse Spruce, MD  ELIQUIS 5 MG TABS tablet TAKE 1 TABLET BY MOUTH TWICE DAILY. Patient taking differently: Take 5 mg by mouth 2 (two) times daily.  10/05/17  Yes Hagler, Fleet Contras, MD  EPINEPHrine 0.3 mg/0.3 mL IJ SOAJ injection Inject 0.3 mg into the muscle once.  12/23/16  Yes [provider]  fluticasone (FLONASE) 50 MCG/ACT nasal spray Place 1 spray into both nostrils daily as needed for allergies or rhinitis.   Yes [provider]  hydrochlorothiazide (HYDRODIURIL) 25 MG tablet Take 1 tablet (25 mg total) by mouth daily. 11/19/16   Aliene Beams,  MD  HYDROcodone-acetaminophen (NORCO) 5-325 MG tablet Take 1 tablet by mouth every 6 (six) hours as needed. 10/17/18   Addalee Kavanagh, Mayer Maskerourtney F, MD  predniSONE (STERAPRED UNI-PAK 21 TAB) 5 MG (21) TBPK tablet Take 6 pills first day; 5 pills second day; 4 pills third day; 3 pills fourth day; 2 pills next day and 1 pill last day. 11/25/17   Darreld McleanKeeling, Wayne, MD    Family History Family History  Problem Relation Age of Onset  . Hypertension Mother   . Diabetes Mother   . Dementia Mother   . Arthritis Mother   . Arthritis Father     Social History Social History   Tobacco Use  . Smoking status: Never Smoker  . Smokeless tobacco: Never Used  Substance Use Topics  . Alcohol use: No  . Drug use:  No     Allergies   Fish allergy and Tomato   Review of Systems Review of Systems  Constitutional: Negative for fever.  Respiratory: Negative for shortness of breath.   Cardiovascular: Negative for chest pain.  Musculoskeletal:       Right knee pain  Skin: Negative for color change.  All other systems reviewed and are negative.    Physical Exam Updated Vital Signs BP (!) 157/97 (BP Location: Right Arm)   Pulse 92   Temp 99.3 F (37.4 C) (Oral)   Resp 18   Ht 1.651 m (5\' 5" )   Wt 108.9 kg   SpO2 98%   BMI 39.94 kg/m   Physical Exam Vitals signs and nursing note reviewed.  Constitutional:      Appearance: She is well-developed. She is obese.  HENT:     Head: Normocephalic and atraumatic.  Cardiovascular:     Rate and Rhythm: Normal rate and regular rhythm.  Pulmonary:     Effort: Pulmonary effort is normal. No respiratory distress.  Abdominal:     Palpations: Abdomen is soft.     Tenderness: There is no abdominal tenderness.  Musculoskeletal:     Comments: Exam somewhat limited by body habitus, there does appear to be a slight effusion of the right knee, tenderness to palpation over the posterior aspect of the knee in the popliteal fossa, limited range of motion secondary to pain, no overlying skin changes or erythema, no joint laxity noted  Skin:    General: Skin is warm and dry.  Neurological:     Mental Status: She is alert and oriented to person, place, and time.  Psychiatric:        Mood and Affect: Mood normal.      ED Treatments / Results  Labs (all labs ordered are listed, but only abnormal results are displayed) Labs Reviewed - No data to display  EKG None  Radiology Dg Knee Complete 4 Views Right  Result Date: 10/17/2018 CLINICAL DATA:  Right knee pain. No known injury. EXAM: RIGHT KNEE - COMPLETE 4+ VIEW COMPARISON:  08/25/2017 FINDINGS: Advanced tricompartmental osteoarthritis with peripheral spurring. Medial tibiofemoral and  patellofemoral joint space narrowing. There is chondrocalcinosis. Chronic ovoid osseous density adjacent to the medial tibial metaphysis. There is a joint effusion. No acute osseous abnormality. IMPRESSION: 1. Advanced tricompartmental osteoarthritis with joint effusion. 2. No acute osseous abnormality. Electronically Signed   By: Narda RutherfordMelanie  Sanford M.D.   On: 10/17/2018 02:19    Procedures Procedures (including critical care time)  Medications Ordered in ED Medications  oxyCODONE-acetaminophen (PERCOCET/ROXICET) 5-325 MG per tablet 1 tablet (1 tablet Oral Given 10/17/18 0117)  Initial Impression / Assessment and Plan / ED Course  I have reviewed the triage vital signs and the nursing notes.  Pertinent labs & imaging results that were available during my care of the patient were reviewed by me and considered in my medical decision making (see chart for details).        Patient presents with knee pain.  History of the same.  She is overall nontoxic-appearing.  This is backed atraumatic arthritis versus possible popliteal Baker's cyst.  DVT is also consideration.  Patient is currently taking her Eliquis.  Low suspicion at this time for septic arthritis.  Patient was given pain medication.  X-ray showed tricompartmental osteoarthritis with some effusion.  Recommend rice therapy.  Given that she is on Eliquis, she is not a candidate for NSAIDs.  Will prescribe a short course of hydrocodone.  Additionally, will have patient return for ultrasound to rule out DVT.  Continue Eliquis in the meantime.  After history, exam, and medical workup I feel the patient has been appropriately medically screened and is safe for discharge home. Pertinent diagnoses were discussed with the patient. Patient was given return precautions.   Final Clinical Impressions(s) / ED Diagnoses   Final diagnoses:  Acute pain of right knee    ED Discharge Orders         Ordered    HYDROcodone-acetaminophen (NORCO) 5-325  MG tablet  Every 6 hours PRN     10/17/18 0226    US Venous Img Lower Unilateral Right     10/17/18 0226           Merryl Hacker, MD 10/17/18 0230

## 2018-10-17 NOTE — Discharge Instructions (Addendum)
You were seen today for knee pain.  You have significant arthritis in the right knee.  This could be contributing.  Additionally, you need to return for an ultrasound later today rule out DVT.  Take pain medication as prescribed.  Do not drive while taking pain medication.  Ice and elevate the knee.

## 2018-10-18 ENCOUNTER — Ambulatory Visit (HOSPITAL_COMMUNITY)
Admission: RE | Admit: 2018-10-18 | Discharge: 2018-10-18 | Disposition: A | Discharge status: Home / Self Care | Payer: Medicare Other | Source: Ambulatory Visit | Attending: Emergency Medicine | Admitting: Emergency Medicine

## 2018-10-18 DIAGNOSIS — M79604 Pain in right leg: Secondary | ICD-10-CM

## 2018-10-18 DIAGNOSIS — M79661 Pain in right lower leg: Secondary | ICD-10-CM | POA: Diagnosis not present

## 2018-10-20 ENCOUNTER — Other Ambulatory Visit (HOSPITAL_COMMUNITY): Payer: Medicare Other

## 2018-10-20 ENCOUNTER — Ambulatory Visit (HOSPITAL_COMMUNITY): Payer: Medicare Other

## 2018-11-05 DIAGNOSIS — I82501 Chronic embolism and thrombosis of unspecified deep veins of right lower extremity: Secondary | ICD-10-CM | POA: Diagnosis not present

## 2018-11-05 DIAGNOSIS — Z0001 Encounter for general adult medical examination with abnormal findings: Secondary | ICD-10-CM | POA: Diagnosis not present

## 2018-11-05 DIAGNOSIS — I10 Essential (primary) hypertension: Secondary | ICD-10-CM | POA: Diagnosis not present

## 2018-11-10 ENCOUNTER — Encounter (HOSPITAL_COMMUNITY): Payer: Self-pay

## 2018-11-10 ENCOUNTER — Other Ambulatory Visit (HOSPITAL_COMMUNITY): Payer: Medicare Other

## 2018-11-10 ENCOUNTER — Ambulatory Visit (HOSPITAL_COMMUNITY): Payer: Medicare Other

## 2018-11-10 DIAGNOSIS — I82501 Chronic embolism and thrombosis of unspecified deep veins of right lower extremity: Secondary | ICD-10-CM | POA: Diagnosis not present

## 2018-11-10 DIAGNOSIS — Z1331 Encounter for screening for depression: Secondary | ICD-10-CM | POA: Diagnosis not present

## 2018-11-10 DIAGNOSIS — I10 Essential (primary) hypertension: Secondary | ICD-10-CM | POA: Diagnosis not present

## 2018-11-10 DIAGNOSIS — Z1389 Encounter for screening for other disorder: Secondary | ICD-10-CM | POA: Diagnosis not present

## 2018-11-10 DIAGNOSIS — D6859 Other primary thrombophilia: Secondary | ICD-10-CM | POA: Diagnosis not present

## 2018-12-01 ENCOUNTER — Ambulatory Visit (HOSPITAL_COMMUNITY): Admission: RE | Admit: 2018-12-01 | Payer: Medicare Other | Source: Ambulatory Visit

## 2018-12-01 ENCOUNTER — Ambulatory Visit (HOSPITAL_COMMUNITY): Payer: Medicare Other

## 2018-12-02 ENCOUNTER — Encounter: Payer: Self-pay | Admitting: Orthopaedic Surgery

## 2018-12-02 ENCOUNTER — Ambulatory Visit: Payer: Medicare Other | Admitting: Orthopaedic Surgery

## 2019-08-31 ENCOUNTER — Ambulatory Visit (HOSPITAL_COMMUNITY): Payer: Medicare Other

## 2019-08-31 ENCOUNTER — Other Ambulatory Visit (HOSPITAL_COMMUNITY): Payer: Medicare Other

## 2019-09-06 ENCOUNTER — Encounter: Payer: Self-pay | Admitting: Nurse Practitioner

## 2019-11-14 NOTE — Progress Notes (Deleted)
Primary Care Physician:  Avon Gully, MD Primary Gastroenterologist:  Dr.   Bonnetta Barry chief complaint on file.   HPI:   Cristina Munoz is a 69 y.o. female who presents on referral from primary care to schedule colonoscopy.  Nurse/phone triage was deferred office visit due to chronic use of Eliquis (history of DVT and pulmonary embolism) needing evaluation.  Will need clearance to hold Eliquis prior to procedure and possible Lovenox bridge depending on recommendations from the prescriber.  No history of colonoscopy or endoscopy in our system.  Today she states she is doing okay overall.  Past Medical History:  Diagnosis Date  . Arthritis   . Asthma   . Chronic knee pain   . DVT (deep venous thrombosis) (HCC)   . GERD (gastroesophageal reflux disease)   . Hypertension   . Pulmonary embolism [facility])     Past Surgical History:  Procedure Laterality Date  . TUBAL LIGATION      Current Outpatient Medications  Medication Sig Dispense Refill  . acetaminophen (TYLENOL) 650 MG CR tablet Take 650 mg by mouth 2 (two) times daily as needed for pain.    Marland Kitchen albuterol (PROVENTIL HFA;VENTOLIN HFA) 108 (90 Base) MCG/ACT inhaler Inhale 2 puffs into the lungs every 6 (six) hours as needed for wheezing or shortness of breath. 1 Inhaler 0  . amLODipine (NORVASC) 5 MG tablet TAKE 1 TABLET(5 MG) BY MOUTH DAILY 90 tablet 1  . cetirizine (ZYRTEC) 10 MG tablet Take 1 tablet (10 mg total) by mouth daily. (Patient taking differently: Take 10 mg by mouth daily as needed for allergies. ) 30 tablet 5  . ELIQUIS 5 MG TABS tablet TAKE 1 TABLET BY MOUTH TWICE DAILY. (Patient taking differently: Take 5 mg by mouth 2 (two) times daily. ) 60 tablet 2  . EPINEPHrine 0.3 mg/0.3 mL IJ SOAJ injection Inject 0.3 mg into the muscle once.   2  . fluticasone (FLONASE) 50 MCG/ACT nasal spray Place 1 spray into both nostrils daily as needed for allergies or rhinitis.    . hydrochlorothiazide (HYDRODIURIL) 25 MG tablet Take 1  tablet (25 mg total) by mouth daily. 90 tablet 3  . HYDROcodone-acetaminophen (NORCO) 5-325 MG tablet Take 1 tablet by mouth every 6 (six) hours as needed. 10 tablet 0  . predniSONE (STERAPRED UNI-PAK 21 TAB) 5 MG (21) TBPK tablet Take 6 pills first day; 5 pills second day; 4 pills third day; 3 pills fourth day; 2 pills next day and 1 pill last day. 21 tablet 0   No current facility-administered medications for this visit.    Allergies as of 11/15/2019 - Review Complete 10/17/2018  Allergen Reaction Noted  . Fish allergy Other (See Comments) 12/20/2011  . Tomato Swelling 10/04/2016    Family History  Problem Relation Age of Onset  . Hypertension Mother   . Diabetes Mother   . Dementia Mother   . Arthritis Mother   . Arthritis Father     Social History   Socioeconomic History  . Marital status: Widowed    Spouse name: Not on file  . Number of children: Not on file  . Years of education: Not on file  . Highest education level: Not on file  Occupational History  . Occupation: retired  Tobacco Use  . Smoking status: Never Smoker  . Smokeless tobacco: Never Used  Vaping Use  . Vaping Use: Never used  Substance and Sexual Activity  . Alcohol use: No  . Drug use: No  .  Sexual activity: Never  Other Topics Concern  . Not on file  Social History Narrative   Widowed. Has a living son and daughter. Does not smoke. Attends church. Has grandchildren, six.    Social Determinants of Health   Financial Resource Strain:   . Difficulty of Paying Living Expenses: Not on file  Food Insecurity:   . Worried About Programme researcher, broadcasting/film/video in the Last Year: Not on file  . Ran Out of Food in the Last Year: Not on file  Transportation Needs:   . Lack of Transportation (Medical): Not on file  . Lack of Transportation (Non-Medical): Not on file  Physical Activity:   . Days of Exercise per Week: Not on file  . Minutes of Exercise per Session: Not on file  Stress:   . Feeling of Stress : Not  on file  Social Connections:   . Frequency of Communication with Friends and Family: Not on file  . Frequency of Social Gatherings with Friends and Family: Not on file  . Attends Religious Services: Not on file  . Active Member of Clubs or Organizations: Not on file  . Attends Banker Meetings: Not on file  . Marital Status: Not on file  Intimate Partner Violence:   . Fear of Current or Ex-Partner: Not on file  . Emotionally Abused: Not on file  . Physically Abused: Not on file  . Sexually Abused: Not on file    Subjective:*** Review of Systems  Constitutional: Negative for chills, fever, malaise/fatigue and weight loss.  HENT: Negative for congestion and sore throat.   Respiratory: Negative for cough and shortness of breath.   Cardiovascular: Negative for chest pain and palpitations.  Gastrointestinal: Negative for abdominal pain, blood in stool, diarrhea, melena, nausea and vomiting.  Musculoskeletal: Negative for joint pain and myalgias.  Skin: Negative for rash.  Neurological: Negative for dizziness and weakness.  Endo/Heme/Allergies: Does not bruise/bleed easily.  Psychiatric/Behavioral: Negative for depression. The patient is not nervous/anxious.   All other systems reviewed and are negative.      Objective: There were no vitals taken for this visit. Physical Exam Vitals and nursing note reviewed.  Constitutional:      General: She is not in acute distress.    Appearance: Normal appearance. She is well-developed. She is not ill-appearing, toxic-appearing or diaphoretic.  HENT:     Head: Normocephalic and atraumatic.     Nose: No congestion or rhinorrhea.  Eyes:     General: No scleral icterus. Cardiovascular:     Rate and Rhythm: Normal rate and regular rhythm.     Heart sounds: Normal heart sounds.  Pulmonary:     Effort: Pulmonary effort is normal. No respiratory distress.     Breath sounds: Normal breath sounds.  Abdominal:     General: Bowel  sounds are normal.     Palpations: Abdomen is soft. There is no hepatomegaly, splenomegaly or mass.     Tenderness: There is no abdominal tenderness. There is no guarding or rebound.     Hernia: No hernia is present.  Skin:    General: Skin is warm and dry.     Coloration: Skin is not jaundiced.     Findings: No rash.  Neurological:     General: No focal deficit present.     Mental Status: She is alert and oriented to person, place, and time.  Psychiatric:        Attention and Perception: Attention normal.  Mood and Affect: Mood normal.        Speech: Speech normal.        Behavior: Behavior normal.        Thought Content: Thought content normal.        Cognition and Memory: Cognition and memory normal.      Assessment:  ***   Plan: ***    Thank you for allowing [place] to participate in the care of Shenell J Vanbergen  Wynne Dust, DNP, AGNP-C Adult & Gerontological Nurse Practitioner [facility]   11/14/2019 7:58 PM   Disclaimer: This note was dictated with voice recognition software. Similar sounding words can inadvertently be transcribed and may not be corrected upon review.

## 2019-11-15 ENCOUNTER — Ambulatory Visit: Payer: Medicare Other | Admitting: Nurse Practitioner

## 2020-01-10 ENCOUNTER — Other Ambulatory Visit (HOSPITAL_COMMUNITY): Payer: Self-pay | Admitting: Internal Medicine

## 2020-01-10 DIAGNOSIS — Z1231 Encounter for screening mammogram for malignant neoplasm of breast: Secondary | ICD-10-CM

## 2020-01-12 ENCOUNTER — Encounter: Payer: Self-pay | Admitting: Internal Medicine

## 2020-02-06 ENCOUNTER — Ambulatory Visit (HOSPITAL_COMMUNITY): Payer: Medicare Other

## 2020-03-06 ENCOUNTER — Ambulatory Visit: Payer: Medicare Other | Admitting: Nurse Practitioner

## 2020-03-06 ENCOUNTER — Encounter: Payer: Self-pay | Admitting: Internal Medicine

## 2020-03-06 NOTE — Progress Notes (Deleted)
Primary Care Physician:  Avon Gully, MD Primary Gastroenterologist:  Dr. Jena Gauss  No chief complaint on file.   HPI:   Cristina Munoz is a 70 y.o. female who presents on referral from primary care to schedule colonoscopy.  Nurse/phone triage was deferred office visit due to chronic anticoagulation with Eliquis.  Reviewed information provided with referral including ***.  No history of colonoscopy was found in our system.  Today she states doing okay overall.  Past Medical History:  Diagnosis Date  . Arthritis   . Asthma   . Chronic knee pain   . DVT (deep venous thrombosis) (HCC)   . GERD (gastroesophageal reflux disease)   . Hypertension   . Pulmonary embolism [facility])     Past Surgical History:  Procedure Laterality Date  . TUBAL LIGATION      Current Outpatient Medications  Medication Sig Dispense Refill  . acetaminophen (TYLENOL) 650 MG CR tablet Take 650 mg by mouth 2 (two) times daily as needed for pain.    Marland Kitchen albuterol (PROVENTIL HFA;VENTOLIN HFA) 108 (90 Base) MCG/ACT inhaler Inhale 2 puffs into the lungs every 6 (six) hours as needed for wheezing or shortness of breath. 1 Inhaler 0  . amLODipine (NORVASC) 5 MG tablet TAKE 1 TABLET(5 MG) BY MOUTH DAILY 90 tablet 1  . cetirizine (ZYRTEC) 10 MG tablet Take 1 tablet (10 mg total) by mouth daily. (Patient taking differently: Take 10 mg by mouth daily as needed for allergies. ) 30 tablet 5  . ELIQUIS 5 MG TABS tablet TAKE 1 TABLET BY MOUTH TWICE DAILY. (Patient taking differently: Take 5 mg by mouth 2 (two) times daily. ) 60 tablet 2  . EPINEPHrine 0.3 mg/0.3 mL IJ SOAJ injection Inject 0.3 mg into the muscle once.   2  . fluticasone (FLONASE) 50 MCG/ACT nasal spray Place 1 spray into both nostrils daily as needed for allergies or rhinitis.    . hydrochlorothiazide (HYDRODIURIL) 25 MG tablet Take 1 tablet (25 mg total) by mouth daily. 90 tablet 3  . HYDROcodone-acetaminophen (NORCO) 5-325 MG tablet Take 1 tablet by mouth  every 6 (six) hours as needed. 10 tablet 0  . predniSONE (STERAPRED UNI-PAK 21 TAB) 5 MG (21) TBPK tablet Take 6 pills first day; 5 pills second day; 4 pills third day; 3 pills fourth day; 2 pills next day and 1 pill last day. 21 tablet 0   No current facility-administered medications for this visit.    Allergies as of 03/06/2020 - Review Complete 10/17/2018  Allergen Reaction Noted  . Fish allergy Other (See Comments) 12/20/2011  . Tomato Swelling 10/04/2016    Family History  Problem Relation Age of Onset  . Hypertension Mother   . Diabetes Mother   . Dementia Mother   . Arthritis Mother   . Arthritis Father     Social History   Socioeconomic History  . Marital status: Widowed    Spouse name: Not on file  . Number of children: Not on file  . Years of education: Not on file  . Highest education level: Not on file  Occupational History  . Occupation: retired  Tobacco Use  . Smoking status: Never Smoker  . Smokeless tobacco: Never Used  Vaping Use  . Vaping Use: Never used  Substance and Sexual Activity  . Alcohol use: No  . Drug use: No  . Sexual activity: Never  Other Topics Concern  . Not on file  Social History Narrative   Widowed. Has  a living son and daughter. Does not smoke. Attends church. Has grandchildren, six.    Social Determinants of Health   Financial Resource Strain: Not on file  Food Insecurity: Not on file  Transportation Needs: Not on file  Physical Activity: Not on file  Stress: Not on file  Social Connections: Not on file  Intimate Partner Violence: Not on file    Subjective:*** Review of Systems  Constitutional: Negative for chills, fever, malaise/fatigue and weight loss.  HENT: Negative for congestion and sore throat.   Respiratory: Negative for cough and shortness of breath.   Cardiovascular: Negative for chest pain and palpitations.  Gastrointestinal: Negative for abdominal pain, blood in stool, diarrhea, melena, nausea and  vomiting.  Musculoskeletal: Negative for joint pain and myalgias.  Skin: Negative for rash.  Neurological: Negative for dizziness and weakness.  Endo/Heme/Allergies: Does not bruise/bleed easily.  Psychiatric/Behavioral: Negative for depression. The patient is not nervous/anxious.   All other systems reviewed and are negative.      Objective: There were no vitals taken for this visit. Physical Exam Vitals and nursing note reviewed.  Constitutional:      General: She is not in acute distress.    Appearance: Normal appearance. She is well-developed. She is not ill-appearing, toxic-appearing or diaphoretic.  HENT:     Head: Normocephalic and atraumatic.     Nose: No congestion or rhinorrhea.  Eyes:     General: No scleral icterus. Cardiovascular:     Rate and Rhythm: Normal rate and regular rhythm.     Heart sounds: Normal heart sounds.  Pulmonary:     Effort: Pulmonary effort is normal. No respiratory distress.     Breath sounds: Normal breath sounds.  Abdominal:     General: Bowel sounds are normal.     Palpations: Abdomen is soft. There is no hepatomegaly, splenomegaly or mass.     Tenderness: There is no abdominal tenderness. There is no guarding or rebound.     Hernia: No hernia is present.  Skin:    General: Skin is warm and dry.     Coloration: Skin is not jaundiced.     Findings: No rash.  Neurological:     General: No focal deficit present.     Mental Status: She is alert and oriented to person, place, and time.  Psychiatric:        Attention and Perception: Attention normal.        Mood and Affect: Mood normal.        Speech: Speech normal.        Behavior: Behavior normal.        Thought Content: Thought content normal.        Cognition and Memory: Cognition and memory normal.      Assessment:  ***   Plan: ***    Thank you for allowing [place] to participate in the care of Cristina Munoz  Wynne Dust, DNP, AGNP-C Adult & Gerontological Nurse  Practitioner [facility]   03/06/2020 7:38 AM   Disclaimer: This note was dictated with voice recognition software. Similar sounding words can inadvertently be transcribed and may not be corrected upon review.

## 2020-04-06 DIAGNOSIS — I10 Essential (primary) hypertension: Secondary | ICD-10-CM | POA: Diagnosis not present

## 2020-04-06 DIAGNOSIS — I82501 Chronic embolism and thrombosis of unspecified deep veins of right lower extremity: Secondary | ICD-10-CM | POA: Diagnosis not present

## 2020-04-06 DIAGNOSIS — J4530 Mild persistent asthma, uncomplicated: Secondary | ICD-10-CM | POA: Diagnosis not present

## 2020-04-20 DIAGNOSIS — M1990 Unspecified osteoarthritis, unspecified site: Secondary | ICD-10-CM | POA: Diagnosis not present

## 2020-04-20 DIAGNOSIS — I10 Essential (primary) hypertension: Secondary | ICD-10-CM | POA: Diagnosis not present

## 2020-09-03 ENCOUNTER — Other Ambulatory Visit: Payer: Self-pay

## 2020-09-03 ENCOUNTER — Emergency Department (HOSPITAL_COMMUNITY): Payer: Medicare Other

## 2020-09-03 ENCOUNTER — Encounter (HOSPITAL_COMMUNITY): Payer: Self-pay

## 2020-09-03 ENCOUNTER — Emergency Department (HOSPITAL_COMMUNITY)
Admission: EM | Admit: 2020-09-03 | Discharge: 2020-09-03 | Disposition: A | Discharge status: Home / Self Care | Payer: Medicare Other | Attending: Emergency Medicine | Admitting: Emergency Medicine

## 2020-09-03 DIAGNOSIS — R1032 Left lower quadrant pain: Secondary | ICD-10-CM | POA: Diagnosis not present

## 2020-09-03 DIAGNOSIS — Z7901 Long term (current) use of anticoagulants: Secondary | ICD-10-CM | POA: Diagnosis not present

## 2020-09-03 DIAGNOSIS — R109 Unspecified abdominal pain: Secondary | ICD-10-CM | POA: Diagnosis not present

## 2020-09-03 DIAGNOSIS — M546 Pain in thoracic spine: Secondary | ICD-10-CM | POA: Insufficient documentation

## 2020-09-03 DIAGNOSIS — J45909 Unspecified asthma, uncomplicated: Secondary | ICD-10-CM | POA: Insufficient documentation

## 2020-09-03 DIAGNOSIS — I10 Essential (primary) hypertension: Secondary | ICD-10-CM | POA: Diagnosis not present

## 2020-09-03 DIAGNOSIS — Z79899 Other long term (current) drug therapy: Secondary | ICD-10-CM | POA: Insufficient documentation

## 2020-09-03 LAB — COMPREHENSIVE METABOLIC PANEL
ALT: 9 U/L (ref 0–44)
AST: 14 U/L — ABNORMAL LOW (ref 15–41)
Albumin: 3.8 g/dL (ref 3.5–5.0)
Alkaline Phosphatase: 96 U/L (ref 38–126)
Anion gap: 4 — ABNORMAL LOW (ref 5–15)
BUN: 15 mg/dL (ref 8–23)
CO2: 27 mmol/L (ref 22–32)
Calcium: 8.9 mg/dL (ref 8.9–10.3)
Chloride: 108 mmol/L (ref 98–111)
Creatinine, Ser: 0.74 mg/dL (ref 0.44–1.00)
GFR, Estimated: >60 mL/min (ref >60)
Glucose, Bld: 104 mg/dL — ABNORMAL HIGH (ref 70–99)
Potassium: 3.8 mmol/L (ref 3.5–5.1)
Sodium: 139 mmol/L (ref 135–145)
Total Bilirubin: 0.5 mg/dL (ref 0.3–1.2)
Total Protein: 7.4 g/dL (ref 6.5–8.1)

## 2020-09-03 LAB — CBC WITH DIFFERENTIAL/PLATELET
Abs Immature Granulocytes: 0.01 10*3/uL (ref 0.00–0.07)
Basophils Absolute: 0 10*3/uL (ref 0.0–0.1)
Basophils Relative: 1 %
Eosinophils Absolute: 0.1 10*3/uL (ref 0.0–0.5)
Eosinophils Relative: 2 %
HCT: 35.5 % — ABNORMAL LOW (ref 36.0–46.0)
Hemoglobin: 11.4 g/dL — ABNORMAL LOW (ref 12.0–15.0)
Immature Granulocytes: 0 %
Lymphocytes Relative: 43 %
Lymphs Abs: 2.2 10*3/uL (ref 0.7–4.0)
MCH: 29.1 pg (ref 26.0–34.0)
MCHC: 32.1 g/dL (ref 30.0–36.0)
MCV: 90.6 fL (ref 80.0–100.0)
Monocytes Absolute: 0.2 10*3/uL (ref 0.1–1.0)
Monocytes Relative: 4 %
Neutro Abs: 2.5 10*3/uL (ref 1.7–7.7)
Neutrophils Relative %: 50 %
Platelets: 222 10*3/uL (ref 150–400)
RBC: 3.92 MIL/uL (ref 3.87–5.11)
RDW: 15.2 % (ref 11.5–15.5)
WBC: 5 10*3/uL (ref 4.0–10.5)
nRBC: 0 % (ref 0.0–0.2)

## 2020-09-03 LAB — URINALYSIS, ROUTINE W REFLEX MICROSCOPIC
Bilirubin Urine: NEGATIVE
Glucose, UA: NEGATIVE mg/dL
Hgb urine dipstick: NEGATIVE
Ketones, ur: NEGATIVE mg/dL
Leukocytes,Ua: NEGATIVE
Nitrite: NEGATIVE
Protein, ur: NEGATIVE mg/dL
Specific Gravity, Urine: 1.015 (ref 1.005–1.030)
pH: 6 (ref 5.0–8.0)

## 2020-09-03 MED ORDER — IOHEXOL 350 MG/ML SOLN
100.0000 mL | Freq: Once | INTRAVENOUS | Status: AC | PRN
  Administered 2020-09-03: 100 mL via INTRAVENOUS

## 2020-09-03 NOTE — ED Provider Notes (Signed)
[facility] EMERGENCY DEPARTMENT Provider Note   CSN: [id number] Arrival date & time: 09/03/20  1011     History Chief Complaint  Patient presents with   Flank Pain    Cristina Munoz is a 70 y.o. female with past medical history significant for chronic knee pain, asthma, PE, hypertension, GERD who presents for evaluation of left lower quadrant pain.  Pain is been constant x2 weeks.  Worse when she lays on her left side.  Does not extend into her legs.  She has no chest pain, shortness of breath.  Walks with a cane at baseline.  Patient describes her pain as "aching."  Chronically anticoagulated on Eliquis, has not missed any doses.  No fever, chills, falls, injuries, hip pain, midline back pain, bowel or bladder incontinence, saddle paresthesia, numbness, weakness to extremities.  Patient does note that she has been drinking more sodas than normal.  She denies fever, chills, headache, lightness, dizziness, pleuritic pain, polyuria, dysuria, diarrhea, constipation, melena or blood per rectum.  Pain has not worsened however is not improved.  Denies additional aggravating or alleviating factors.  History obtained from patient and past medical records.  No interpreter used.  HPI     Past Medical History:  Diagnosis Date   Arthritis    Asthma    Chronic knee pain    DVT (deep venous thrombosis) (HCC)    GERD (gastroesophageal reflux disease)    Hypertension    Pulmonary embolism [facility])     Patient Active Problem List   Diagnosis Date Noted   Class 2 severe obesity due to excess calories with serious comorbidity and body mass index (BMI) of 37.0 to 37.9 in adult (HCC) 03/25/2017   Estrogen deficiency 03/25/2017   HTN, goal below 140/90 12/25/2016   Mild intermittent asthma without complication 12/23/2016   Chronic rhinitis 12/23/2016   Angio-edema 12/23/2016   Elevated blood sugar 11/19/2016   Anaphylaxis 11/19/2016   Syncope    Pleuritic chest pain 10/05/2016   Acute pulmonary  embolus (HCC) 10/04/2016   LOWER LEG, ARTHRITIS, DEGEN./OSTEO 03/30/2007   KNEE PAIN 03/30/2007   DISC DEGENERATION 03/30/2007   BACK PAIN 03/30/2007    Past Surgical History:  Procedure Laterality Date   TUBAL LIGATION       OB History   No obstetric history on file.     Family History  Problem Relation Age of Onset   Hypertension Mother    Diabetes Mother    Dementia Mother    Arthritis Mother    Arthritis Father     Social History   Tobacco Use   Smoking status: Never   Smokeless tobacco: Never  Vaping Use   Vaping Use: Never used  Substance Use Topics   Alcohol use: No   Drug use: No    Home Medications Prior to Admission medications   Medication Sig Start Date End Date Taking? Authorizing Provider  acetaminophen (TYLENOL) 650 MG CR tablet Take 650 mg by mouth 2 (two) times daily as needed for pain.    [provider]  albuterol (PROVENTIL HFA;VENTOLIN HFA) 108 (90 Base) MCG/ACT inhaler Inhale 2 puffs into the lungs every 6 (six) hours as needed for wheezing or shortness of breath. 10/22/16   Aliene Beams, MD  amLODipine (NORVASC) 5 MG tablet TAKE 1 TABLET(5 MG) BY MOUTH DAILY 04/23/18   Laqueta Linden, MD  cetirizine (ZYRTEC) 10 MG tablet Take 1 tablet (10 mg total) by mouth daily. Patient taking differently: Take 10 mg  by mouth daily as needed for allergies.  12/23/16   Alfonse Spruce, MD  ELIQUIS 5 MG TABS tablet TAKE 1 TABLET BY MOUTH TWICE DAILY. Patient taking differently: Take 5 mg by mouth 2 (two) times daily.  10/05/17   Aliene Beams, MD  EPINEPHrine 0.3 mg/0.3 mL IJ SOAJ injection Inject 0.3 mg into the muscle once.  12/23/16   [provider]  fluticasone (FLONASE) 50 MCG/ACT nasal spray Place 1 spray into both nostrils daily as needed for allergies or rhinitis.    [provider]  hydrochlorothiazide (HYDRODIURIL) 25 MG tablet Take 1 tablet (25 mg total) by mouth daily. 11/19/16   Aliene Beams, MD   HYDROcodone-acetaminophen (NORCO) 5-325 MG tablet Take 1 tablet by mouth every 6 (six) hours as needed. 10/17/18   Horton, Mayer Masker, MD  predniSONE (STERAPRED UNI-PAK 21 TAB) 5 MG (21) TBPK tablet Take 6 pills first day; 5 pills second day; 4 pills third day; 3 pills fourth day; 2 pills next day and 1 pill last day. 11/25/17   Darreld Mclean, MD    Allergies    Fish allergy and Tomato  Review of Systems   Review of Systems  Constitutional: Negative.   HENT: Negative.    Respiratory: Negative.    Cardiovascular: Negative.   Gastrointestinal:  Positive for abdominal pain. Negative for abdominal distention, anal bleeding, blood in stool, constipation, diarrhea, nausea, rectal pain and vomiting.  Genitourinary:  Positive for flank pain (Lower flank, left side pain).  Skin: Negative.   Neurological: Negative.   All other systems reviewed and are negative.  Physical Exam Updated Vital Signs BP 124/86 (BP Location: Left Arm)   Pulse 61   Temp 97.9 F (36.6 C) (Oral)   Resp 16   Ht 5\' 5"  (1.651 m)   Wt 104.3 kg   SpO2 99%   BMI 38.27 kg/m   Physical Exam Vitals and nursing note reviewed.  Constitutional:      General: She is not in acute distress.    Appearance: She is well-developed. She is not ill-appearing, toxic-appearing or diaphoretic.  HENT:     Head: Normocephalic and atraumatic.     Jaw: There is normal jaw occlusion.     Nose: Nose normal.     Mouth/Throat:     Mouth: Mucous membranes are moist.  Eyes:     Pupils: Pupils are equal, round, and reactive to light.  Cardiovascular:     Rate and Rhythm: Normal rate.     Pulses: Normal pulses.          Radial pulses are 2+ on the right side and 2+ on the left side.       Dorsalis pedis pulses are 2+ on the right side and 2+ on the left side.     Heart sounds: Normal heart sounds.  Pulmonary:     Effort: Pulmonary effort is normal. No respiratory distress.     Breath sounds: Normal breath sounds and air entry.   Abdominal:     General: Bowel sounds are normal. There is no distension.     Palpations: Abdomen is soft.     Tenderness: There is abdominal tenderness in the left lower quadrant. There is no right CVA tenderness, guarding or rebound. Negative signs include Murphy's sign and McBurney's sign.     Hernia: No hernia is present.    Musculoskeletal:        General: Normal range of motion.     Cervical back: Normal range  of motion.       Back:     Comments: Midline back pain.  No shortening or rotation of legs.  Full range of motion bilateral legs without difficulty.  Negative straight leg raise bilaterally.  No overlying skin changes to hips, pelvis or extremities  Skin:    General: Skin is warm and dry.  Neurological:     General: No focal deficit present.     Mental Status: She is alert.  Psychiatric:        Mood and Affect: Mood normal.    ED Results / Procedures / Treatments   Labs (all labs ordered are listed, but only abnormal results are displayed) Labs Reviewed  CBC WITH DIFFERENTIAL/PLATELET - Abnormal; Notable for the following components:      Result Value   Hemoglobin 11.4 (*)    HCT 35.5 (*)    All other components within normal limits  COMPREHENSIVE METABOLIC PANEL - Abnormal; Notable for the following components:   Glucose, Bld 104 (*)    AST 14 (*)    Anion gap 4 (*)    All other components within normal limits  URINALYSIS, ROUTINE W REFLEX MICROSCOPIC    EKG None  Radiology CT ABDOMEN PELVIS W CONTRAST  Result Date: 09/03/2020 CLINICAL DATA:  Left lower quadrant abdominal pain. Pain for 2 weeks. EXAM: CT ABDOMEN AND PELVIS WITH CONTRAST TECHNIQUE: Multidetector CT imaging of the abdomen and pelvis was performed using the standard protocol following bolus administration of intravenous contrast. CONTRAST:  100mL OMNIPAQUE IOHEXOL 350 MG/ML SOLN COMPARISON:  None. FINDINGS: Lower chest: Linear subsegmental scarring in the left lower lobe. No pneumonia or  pleural effusion. Mild cardiomegaly. Hepatobiliary: No focal liver abnormality is seen. Questionable small intraluminal gallstones, series 2, images 20 and 23. No pericholecystic inflammation or abnormal distention. No biliary dilatation. Pancreas: No ductal dilatation or inflammation. Spleen: Normal in size without focal abnormality. Splenule anteriorly. Adrenals/Urinary Tract: Normal adrenal glands. No hydronephrosis or perinephric edema. Homogeneous renal enhancement with symmetric excretion on delayed phase imaging. No renal calculi. There are bilateral low-density renal cortical lesions, largest in the upper right kidney measuring 19 mm. Larger of these lesions measures simple cysts, smaller lesions are too small to characterize but also likely cysts. No suspicious solid lesion. Urinary bladder is partially distended without wall thickening. Stomach/Bowel: Small hiatal hernia. The stomach is decompressed. There is no small bowel obstruction or inflammation. Normal appendix. Small to moderate colonic stool burden. No colonic wall thickening or inflammatory change. No definite diverticular disease. Vascular/Lymphatic: Normal caliber abdominal aorta. Patent portal vein. No portal venous or mesenteric gas no abdominopelvic adenopathy. Reproductive: Heterogeneous enlarged uterus with multiple lesions, some of which are calcified. These are most consistent with fibroids. Calcified lesion in the right uterine body measures 4.7 cm. There is an adjacent low-density that may represent a degenerating fibroid that measures 4.7 cm in the central left uterus. Quiescent ovaries. No adnexal mass. Other: No ascites or free air. No abdominopelvic fluid collection. No significant abdominal wall hernia. Musculoskeletal: Multilevel degenerative change in the spine. Minimal broad-based curvature. There is degenerative change of both sacroiliac joints. There are no acute or suspicious osseous abnormalities. IMPRESSION: 1. No acute  abnormality in the abdomen or pelvis. 2. Enlarged fibroid uterus. Many of these fibroids are calcified, however low-density involving a fibroid involving the left central aspect of the uterus may represent underlying degeneration and contribute to pain. Consider further evaluation with pelvic ultrasound on an elective basis. 3. Questionable small intraluminal  gallstones without gallbladder inflammation. 4. Small hiatal hernia. Electronically Signed   By: Narda Rutherford M.D.   On: 09/03/2020 15:15    Procedures Procedures   Medications Ordered in ED Medications  iohexol (OMNIPAQUE) 350 MG/ML injection 100 mL (100 mLs Intravenous Contrast Given 09/03/20 1437)   ED Course  I have reviewed the triage vital signs and the nursing notes.  Pertinent labs & imaging results that were available during my care of the patient were reviewed by me and considered in my medical decision making (see chart for details).  Here for evaluation of left lower abdominal pain over the last 2 weeks.  Worse when she lays on her left side.  She is afebrile, nonseptic, not ill-appearing.  She has no associated exertional or pleuritic chest pain.  Pain located to lower abdomen and left flank.  No hematuria, history of stones.  She is neurovascularly intact.  No radicular symptoms.  No bowel or bladder incontinence, saddle paresthesia.  She is neurovascularly intact.  She has no overlying skin changes.  No pelvic pain, urinary complaints.  Has denied any vaginal discharge or concerns for any STDs.  Plan on labs, imaging and reassess.  Labs and imaging personally reviewed and interpreted:  CBC without leukocytosis Metabolic panel with glucose 104 UA negative for infection CT abdomen and pelvis with fibroid uterus however no acute findings  Patient reassessed.  Unclear etiology of her pain.  At this time I have low suspicion for acute neurosurgical emergency, intra-abdominal process, AAA, dissection.  Discussed symptomatic  management at home, follow-up with primary care provider.  She will return for new or worsening symptoms.  On repeat exam patient does not have a surgical abdomin and there are no peritoneal signs.  No indication of appendicitis, bowel obstruction, bowel perforation, cholecystitis, diverticulitis, torsion, TOA .  The patient has been appropriately medically screened and/or stabilized in the ED. I have low suspicion for any other emergent medical condition which would require further screening, evaluation or treatment in the ED or require inpatient management.  Patient is hemodynamically stable and in no acute distress.  Patient able to ambulate in department prior to ED.  Evaluation does not show acute pathology that would require ongoing or additional emergent interventions while in the emergency department or further inpatient treatment.  I have discussed the diagnosis with the patient and answered all questions.  Pain is been managed while in the emergency department and patient has no further complaints prior to discharge.  Patient is comfortable with plan discussed in room and is stable for discharge at this time.  I have discussed strict return precautions for returning to the emergency department.  Patient was encouraged to follow-up with PCP/specialist refer to at discharge.     MDM Rules/Calculators/A&P                            Final Clinical Impression(s) / ED Diagnoses Final diagnoses:  Left lower quadrant abdominal pain    Rx / DC Orders ED Discharge Orders     None        Annalie Wenner A, PA-C 09/03/20 2150    Bethann Berkshire, MD 09/05/20 [phone]

## 2020-09-03 NOTE — ED Triage Notes (Signed)
Pt. Arrived POV. Pt. States their left side is hurting them. Pt. States their side has been hurting for 2 weeks.

## 2020-09-03 NOTE — Discharge Instructions (Addendum)
Follow-up with your primary care provider in the next few days  Return for new or worsening symptoms

## 2021-01-17 ENCOUNTER — Other Ambulatory Visit: Payer: Self-pay

## 2021-01-17 ENCOUNTER — Encounter (HOSPITAL_COMMUNITY): Payer: Self-pay

## 2021-01-17 ENCOUNTER — Emergency Department (HOSPITAL_COMMUNITY): Payer: Medicare Other

## 2021-01-17 ENCOUNTER — Emergency Department (HOSPITAL_COMMUNITY)
Admission: EM | Admit: 2021-01-17 | Discharge: 2021-01-17 | Disposition: A | Discharge status: Home / Self Care | Payer: Medicare Other | Attending: Emergency Medicine | Admitting: Emergency Medicine

## 2021-01-17 DIAGNOSIS — I10 Essential (primary) hypertension: Secondary | ICD-10-CM | POA: Diagnosis not present

## 2021-01-17 DIAGNOSIS — M25561 Pain in right knee: Secondary | ICD-10-CM | POA: Diagnosis not present

## 2021-01-17 DIAGNOSIS — M25562 Pain in left knee: Secondary | ICD-10-CM | POA: Diagnosis not present

## 2021-01-17 DIAGNOSIS — G8929 Other chronic pain: Secondary | ICD-10-CM | POA: Diagnosis not present

## 2021-01-17 DIAGNOSIS — J45909 Unspecified asthma, uncomplicated: Secondary | ICD-10-CM | POA: Diagnosis not present

## 2021-01-17 DIAGNOSIS — Z7901 Long term (current) use of anticoagulants: Secondary | ICD-10-CM | POA: Diagnosis not present

## 2021-01-17 DIAGNOSIS — Z86718 Personal history of other venous thrombosis and embolism: Secondary | ICD-10-CM | POA: Diagnosis not present

## 2021-01-17 DIAGNOSIS — Z79899 Other long term (current) drug therapy: Secondary | ICD-10-CM | POA: Diagnosis not present

## 2021-01-17 DIAGNOSIS — I2699 Other pulmonary embolism without acute cor pulmonale: Secondary | ICD-10-CM

## 2021-01-17 MED ORDER — HYDROCODONE-ACETAMINOPHEN 5-325 MG PO TABS: 1.0000 | ORAL_TABLET | Freq: Four times a day (QID) | ORAL | 0 refills | Status: DC | PRN

## 2021-01-17 MED ORDER — HYDROCODONE-ACETAMINOPHEN 5-325 MG PO TABS
1.0000 | ORAL_TABLET | Freq: Once | ORAL | Status: AC
  Administered 2021-01-17: 08:00:00 1 via ORAL
  Filled 2021-01-17: qty 1

## 2021-01-17 MED ORDER — APIXABAN 5 MG PO TABS: 5.0000 mg | ORAL_TABLET | Freq: Two times a day (BID) | ORAL | 0 refills | Status: AC

## 2021-01-17 NOTE — ED Notes (Signed)
Patient's second blood pressure was 203/106 Patient stated she has history of HTN but could not afford her meds.

## 2021-01-17 NOTE — ED Triage Notes (Signed)
Pt presents to ED with complaints of left leg and left knee pain started at 0530 this morning.

## 2021-01-17 NOTE — ED Provider Notes (Signed)
[facility] EMERGENCY DEPARTMENT Provider Note  CSN: [id number] Arrival date & time: 01/17/21 9767    History Chief Complaint  Patient presents with   Leg Pain    Cristina Munoz is a 70 y.o. female chronic bilateral knee pain reports she woke up this morning around 0530 to use the rest room and had sharp, severe pain behind her R knee, worse with standing and walking. She also has a history of DVT, has been off her eliquis for over a week because she cannot afford it. She also has trouble seeing her PCP due to owing them money as well. No CP, SOB. No falls or injuries.    Past Medical History:  Diagnosis Date   Arthritis    Asthma    Chronic knee pain    DVT (deep venous thrombosis) (HCC)    GERD (gastroesophageal reflux disease)    Hypertension    Pulmonary embolism (HCC)     Past Surgical History:  Procedure Laterality Date   TUBAL LIGATION      Family History  Problem Relation Age of Onset   Hypertension Mother    Diabetes Mother    Dementia Mother    Arthritis Mother    Arthritis Father     Social History   Tobacco Use   Smoking status: Never   Smokeless tobacco: Never  Vaping Use   Vaping Use: Never used  Substance Use Topics   Alcohol use: No   Drug use: No     Home Medications Prior to Admission medications   Medication Sig Start Date End Date Taking? Authorizing Provider  HYDROcodone-acetaminophen (NORCO/VICODIN) 5-325 MG tablet Take 1 tablet by mouth every 6 (six) hours as needed for severe pain. 01/17/21  Yes Pollyann Savoy, MD  acetaminophen (TYLENOL) 650 MG CR tablet Take 650 mg by mouth 2 (two) times daily as needed for pain.    [provider]  albuterol (PROVENTIL HFA;VENTOLIN HFA) 108 (90 Base) MCG/ACT inhaler Inhale 2 puffs into the lungs every 6 (six) hours as needed for wheezing or shortness of breath. 10/22/16   Aliene Beams, MD  amLODipine (NORVASC) 5 MG tablet TAKE 1 TABLET(5 MG) BY MOUTH DAILY 04/23/18   Laqueta Linden, MD  apixaban (ELIQUIS) 5 MG TABS tablet Take 1 tablet (5 mg total) by mouth 2 (two) times daily. 01/17/21   Pollyann Savoy, MD  cetirizine (ZYRTEC) 10 MG tablet Take 1 tablet (10 mg total) by mouth daily. Patient taking differently: Take 10 mg by mouth daily as needed for allergies.  12/23/16   Alfonse Spruce, MD  EPINEPHrine 0.3 mg/0.3 mL IJ SOAJ injection Inject 0.3 mg into the muscle once.  12/23/16   [provider]  fluticasone (FLONASE) 50 MCG/ACT nasal spray Place 1 spray into both nostrils daily as needed for allergies or rhinitis.    [provider]  hydrochlorothiazide (HYDRODIURIL) 25 MG tablet Take 1 tablet (25 mg total) by mouth daily. 11/19/16   Aliene Beams, MD  predniSONE (STERAPRED UNI-PAK 21 TAB) 5 MG (21) TBPK tablet Take 6 pills first day; 5 pills second day; 4 pills third day; 3 pills fourth day; 2 pills next day and 1 pill last day. 11/25/17   Darreld Mclean, MD     Allergies    Fish allergy and Tomato   Review of Systems   Review of Systems A comprehensive review of systems was completed and negative except as noted in HPI.    Physical Exam  BP (!) 171/99    Pulse 69    Temp 97.9 F (36.6 C) (Oral)    Resp 20    Ht 5\' 6"  (1.676 m)    Wt 104.3 kg    SpO2 100%    BMI 37.12 kg/m   Physical Exam Vitals and nursing note reviewed.  HENT:     Head: Normocephalic.     Nose: Nose normal.  Eyes:     Extraocular Movements: Extraocular movements intact.  Pulmonary:     Effort: Pulmonary effort is normal.  Musculoskeletal:        General: Tenderness (R popliteal fossa) present. No swelling. Normal range of motion.     Cervical back: Neck supple.     Comments: Exam limited by body habitus, no bony tenderness or large effusion. Pain with ROM but able to move through full range. No signs of infection.   Skin:    Findings: No rash (on exposed skin).  Neurological:     Mental Status: She is alert and oriented to person, place, and  time.  Psychiatric:        Mood and Affect: Mood normal.     ED Results / Procedures / Treatments   Labs (all labs ordered are listed, but only abnormal results are displayed) Labs Reviewed - No data to display  EKG None  Radiology Venous Img Lower Right (DVT Study)  Result Date: 01/17/2021 CLINICAL DATA:  R posterior knee pain, history of DVT, not taking Eliquis EXAM: RIGHT LOWER EXTREMITY VENOUS DOPPLER ULTRASOUND TECHNIQUE: Gray-scale sonography with compression, as well as color and duplex ultrasound, were performed to evaluate the deep venous system(s) from the level of the common femoral vein through the popliteal and proximal calf veins. COMPARISON:  10/18/2018. FINDINGS: VENOUS Normal compressibility of the common femoral, superficial femoral, and popliteal veins, as well as the visualized calf veins. Visualized portions of profunda femoral vein and great saphenous vein unremarkable. No filling defects to suggest DVT on grayscale or color Doppler imaging. Doppler waveforms show normal direction of venous flow, normal respiratory plasticity and response to augmentation. Limited views of the contralateral common femoral vein are unremarkable. Limitations: Patient body habitus limits evaluation. IMPRESSION: No evidence of DVT in the right lower extremity on this limited study. Electronically Signed   By: 10/20/2018 M.D.   On: 01/17/2021 08:22    Procedures Procedures  Medications Ordered in the ED Medications  HYDROcodone-acetaminophen (NORCO/VICODIN) 5-325 MG per tablet 1 tablet (1 tablet Oral Given 01/17/21 0751)     MDM Rules/Calculators/A&P MDM Patient with exacerbation of chronic knee pain. No longer going to Ortho for injections, has been doing well at home with conservative management. She also has DVT and is off her eliquis. Will check doppler, norco for pain in the meantime.   ED Course  I have reviewed the triage vital signs and the nursing  notes.  Pertinent labs & imaging results that were available during my care of the patient were reviewed by me and considered in my medical decision making (see chart for details).  Clinical Course as of 01/17/21 0834  Thu Jan 17, 2021  0830 Jan 19, 2021 neg for DVT. Will give a refill of Eliquis, recommend she work with her PCP on arrangements to continue long term management and to explore manufacturer discounts/assistance.  She was also advised to follow up with Ortho for her chronic knee pain. Rx for Norco short term. ACE wrap for comfort. Continue with walker at home.  [CS]  Clinical Course User Index [CS] Pollyann Savoy, MD    Final Clinical Impression(s) / ED Diagnoses Final diagnoses:  Chronic pain of right knee  History of DVT (deep vein thrombosis)    Rx / DC Orders ED Discharge Orders          Ordered    apixaban (ELIQUIS) 5 MG TABS tablet  2 times daily        01/17/21 0834    HYDROcodone-acetaminophen (NORCO/VICODIN) 5-325 MG tablet  Every 6 hours PRN        01/17/21 0834             Pollyann Savoy, MD 01/17/21 [phone]

## 2021-07-31 DIAGNOSIS — M179 Osteoarthritis of knee, unspecified: Secondary | ICD-10-CM | POA: Diagnosis not present

## 2021-07-31 DIAGNOSIS — I10 Essential (primary) hypertension: Secondary | ICD-10-CM | POA: Diagnosis not present

## 2021-07-31 DIAGNOSIS — I82409 Acute embolism and thrombosis of unspecified deep veins of unspecified lower extremity: Secondary | ICD-10-CM | POA: Diagnosis not present

## 2021-07-31 DIAGNOSIS — R7301 Impaired fasting glucose: Secondary | ICD-10-CM | POA: Diagnosis not present

## 2021-07-31 DIAGNOSIS — E559 Vitamin D deficiency, unspecified: Secondary | ICD-10-CM | POA: Diagnosis not present

## 2021-07-31 DIAGNOSIS — Z0189 Encounter for other specified special examinations: Secondary | ICD-10-CM | POA: Diagnosis not present

## 2021-07-31 DIAGNOSIS — Z6841 Body Mass Index (BMI) 40.0 and over, adult: Secondary | ICD-10-CM | POA: Diagnosis not present

## 2021-07-31 DIAGNOSIS — J4520 Mild intermittent asthma, uncomplicated: Secondary | ICD-10-CM | POA: Diagnosis not present

## 2021-08-09 ENCOUNTER — Other Ambulatory Visit (HOSPITAL_COMMUNITY): Payer: Self-pay | Admitting: Family Medicine

## 2021-08-09 ENCOUNTER — Other Ambulatory Visit: Payer: Self-pay | Admitting: Family Medicine

## 2021-08-09 DIAGNOSIS — E785 Hyperlipidemia, unspecified: Secondary | ICD-10-CM | POA: Diagnosis not present

## 2021-08-09 DIAGNOSIS — Z6839 Body mass index (BMI) 39.0-39.9, adult: Secondary | ICD-10-CM | POA: Diagnosis not present

## 2021-08-09 DIAGNOSIS — G90529 Complex regional pain syndrome I of unspecified lower limb: Secondary | ICD-10-CM | POA: Diagnosis not present

## 2021-08-09 DIAGNOSIS — Z13820 Encounter for screening for osteoporosis: Secondary | ICD-10-CM

## 2021-08-09 DIAGNOSIS — I82409 Acute embolism and thrombosis of unspecified deep veins of unspecified lower extremity: Secondary | ICD-10-CM | POA: Diagnosis not present

## 2021-08-09 DIAGNOSIS — J4520 Mild intermittent asthma, uncomplicated: Secondary | ICD-10-CM | POA: Diagnosis not present

## 2021-08-09 DIAGNOSIS — I739 Peripheral vascular disease, unspecified: Secondary | ICD-10-CM | POA: Diagnosis not present

## 2021-08-09 DIAGNOSIS — R7303 Prediabetes: Secondary | ICD-10-CM | POA: Diagnosis not present

## 2021-08-09 DIAGNOSIS — I10 Essential (primary) hypertension: Secondary | ICD-10-CM | POA: Diagnosis not present

## 2021-08-09 DIAGNOSIS — M179 Osteoarthritis of knee, unspecified: Secondary | ICD-10-CM | POA: Diagnosis not present

## 2021-08-09 DIAGNOSIS — E559 Vitamin D deficiency, unspecified: Secondary | ICD-10-CM | POA: Diagnosis not present

## 2021-08-09 DIAGNOSIS — E6609 Other obesity due to excess calories: Secondary | ICD-10-CM | POA: Diagnosis not present

## 2021-11-17 ENCOUNTER — Emergency Department (HOSPITAL_COMMUNITY): Payer: Medicare Other

## 2021-11-17 ENCOUNTER — Encounter (HOSPITAL_COMMUNITY): Payer: Self-pay | Admitting: Emergency Medicine

## 2021-11-17 ENCOUNTER — Emergency Department (HOSPITAL_COMMUNITY)
Admission: EM | Admit: 2021-11-17 | Discharge: 2021-11-18 | Disposition: A | Discharge status: Home / Self Care | Payer: Medicare Other | Attending: Emergency Medicine | Admitting: Emergency Medicine

## 2021-11-17 DIAGNOSIS — R079 Chest pain, unspecified: Secondary | ICD-10-CM | POA: Diagnosis not present

## 2021-11-17 DIAGNOSIS — K449 Diaphragmatic hernia without obstruction or gangrene: Secondary | ICD-10-CM | POA: Diagnosis not present

## 2021-11-17 DIAGNOSIS — K8020 Calculus of gallbladder without cholecystitis without obstruction: Secondary | ICD-10-CM | POA: Insufficient documentation

## 2021-11-17 DIAGNOSIS — R1013 Epigastric pain: Secondary | ICD-10-CM | POA: Diagnosis present

## 2021-11-17 DIAGNOSIS — N200 Calculus of kidney: Secondary | ICD-10-CM | POA: Diagnosis not present

## 2021-11-17 DIAGNOSIS — R1010 Upper abdominal pain, unspecified: Secondary | ICD-10-CM

## 2021-11-17 DIAGNOSIS — Z7901 Long term (current) use of anticoagulants: Secondary | ICD-10-CM | POA: Diagnosis not present

## 2021-11-17 DIAGNOSIS — R0602 Shortness of breath: Secondary | ICD-10-CM | POA: Insufficient documentation

## 2021-11-17 DIAGNOSIS — R7309 Other abnormal glucose: Secondary | ICD-10-CM | POA: Insufficient documentation

## 2021-11-17 DIAGNOSIS — Z79899 Other long term (current) drug therapy: Secondary | ICD-10-CM | POA: Insufficient documentation

## 2021-11-17 DIAGNOSIS — N281 Cyst of kidney, acquired: Secondary | ICD-10-CM | POA: Diagnosis not present

## 2021-11-17 DIAGNOSIS — I10 Essential (primary) hypertension: Secondary | ICD-10-CM | POA: Diagnosis not present

## 2021-11-17 LAB — CBC WITH DIFFERENTIAL/PLATELET
Abs Immature Granulocytes: 0.03 10*3/uL (ref 0.00–0.07)
Basophils Absolute: 0 10*3/uL (ref 0.0–0.1)
Basophils Relative: 1 %
Eosinophils Absolute: 0.2 10*3/uL (ref 0.0–0.5)
Eosinophils Relative: 4 %
HCT: 31.3 % — ABNORMAL LOW (ref 36.0–46.0)
Hemoglobin: 10.1 g/dL — ABNORMAL LOW (ref 12.0–15.0)
Immature Granulocytes: 1 %
Lymphocytes Relative: 42 %
Lymphs Abs: 2.5 10*3/uL (ref 0.7–4.0)
MCH: 28.7 pg (ref 26.0–34.0)
MCHC: 32.3 g/dL (ref 30.0–36.0)
MCV: 88.9 fL (ref 80.0–100.0)
Monocytes Absolute: 0.3 10*3/uL (ref 0.1–1.0)
Monocytes Relative: 5 %
Neutro Abs: 2.9 10*3/uL (ref 1.7–7.7)
Neutrophils Relative %: 47 %
Platelets: 213 10*3/uL (ref 150–400)
RBC: 3.52 MIL/uL — ABNORMAL LOW (ref 3.87–5.11)
RDW: 15.1 % (ref 11.5–15.5)
WBC: 5.9 10*3/uL (ref 4.0–10.5)
nRBC: 0 % (ref 0.0–0.2)

## 2021-11-17 LAB — COMPREHENSIVE METABOLIC PANEL
ALT: 8 U/L (ref 0–44)
AST: 12 U/L — ABNORMAL LOW (ref 15–41)
Albumin: 3.6 g/dL (ref 3.5–5.0)
Alkaline Phosphatase: 86 U/L (ref 38–126)
Anion gap: 4 — ABNORMAL LOW (ref 5–15)
BUN: 16 mg/dL (ref 8–23)
CO2: 26 mmol/L (ref 22–32)
Calcium: 8.8 mg/dL — ABNORMAL LOW (ref 8.9–10.3)
Chloride: 113 mmol/L — ABNORMAL HIGH (ref 98–111)
Creatinine, Ser: 0.81 mg/dL (ref 0.44–1.00)
GFR, Estimated: >60 mL/min (ref >60)
Glucose, Bld: 131 mg/dL — ABNORMAL HIGH (ref 70–99)
Potassium: 3.5 mmol/L (ref 3.5–5.1)
Sodium: 143 mmol/L (ref 135–145)
Total Bilirubin: 0.4 mg/dL (ref 0.3–1.2)
Total Protein: 7.3 g/dL (ref 6.5–8.1)

## 2021-11-17 LAB — LIPASE, BLOOD: Lipase: 30 U/L (ref 11–51)

## 2021-11-17 LAB — TROPONIN I (HIGH SENSITIVITY)
Troponin I (High Sensitivity): 2 ng/L (ref <18)
Troponin I (High Sensitivity): 3 ng/L (ref <18)

## 2021-11-17 LAB — BRAIN NATRIURETIC PEPTIDE: B Natriuretic Peptide: 71 pg/mL (ref 0.0–100.0)

## 2021-11-17 MED ORDER — PANTOPRAZOLE SODIUM 40 MG IV SOLR
40.0000 mg | Freq: Once | INTRAVENOUS | Status: AC
  Administered 2021-11-17: 40 mg via INTRAVENOUS
  Filled 2021-11-17: qty 10

## 2021-11-17 MED ORDER — HYDROCODONE-ACETAMINOPHEN 5-325 MG PO TABS: 1.0000 | ORAL_TABLET | Freq: Four times a day (QID) | ORAL | 0 refills | Status: AC | PRN

## 2021-11-17 MED ORDER — SODIUM CHLORIDE 0.9 % IV BOLUS
500.0000 mL | Freq: Once | INTRAVENOUS | Status: AC
  Administered 2021-11-17: 500 mL via INTRAVENOUS

## 2021-11-17 MED ORDER — PANTOPRAZOLE SODIUM 20 MG PO TBEC: 20.0000 mg | DELAYED_RELEASE_TABLET | Freq: Every day | ORAL | 0 refills | Status: AC

## 2021-11-17 MED ORDER — ONDANSETRON HCL 4 MG/2ML IJ SOLN
4.0000 mg | Freq: Once | INTRAMUSCULAR | Status: AC
  Administered 2021-11-17: 4 mg via INTRAVENOUS
  Filled 2021-11-17: qty 2

## 2021-11-17 MED ORDER — HYDROMORPHONE HCL 1 MG/ML IJ SOLN
0.5000 mg | Freq: Once | INTRAMUSCULAR | Status: AC
  Administered 2021-11-17: 0.5 mg via INTRAVENOUS
  Filled 2021-11-17: qty 0.5

## 2021-11-17 MED ORDER — IOHEXOL 300 MG/ML  SOLN
100.0000 mL | Freq: Once | INTRAMUSCULAR | Status: AC | PRN
  Administered 2021-11-17: 100 mL via INTRAVENOUS

## 2021-11-17 NOTE — ED Notes (Signed)
ED Provider at bedside. 

## 2021-11-17 NOTE — ED Notes (Signed)
Pt taken to room and then directly to X-Ray. Pt not placed on monitor . RN Teasly-Sinks made aware since PT RN not at desk at the present time.

## 2021-11-17 NOTE — ED Provider Notes (Signed)
[facility] EMERGENCY DEPARTMENT Provider Note   CSN: [id number] Arrival date & time: 11/17/21  1943     History {Add pertinent medical, surgical, social history, OB history to HPI:1} Chief Complaint  Patient presents with   Shortness of Breath    Cristina Munoz is a 71 y.o. female.  Patient has a history of hypertension and DVTs.  She complains of epigastric pain   Shortness of Breath      Home Medications Prior to Admission medications   Medication Sig Start Date End Date Taking? Authorizing Provider  HYDROcodone-acetaminophen (NORCO/VICODIN) 5-325 MG tablet Take 1 tablet by mouth every 6 (six) hours as needed. 11/17/21  Yes Milton Ferguson, MD  pantoprazole (PROTONIX) 20 MG tablet Take 1 tablet (20 mg total) by mouth daily. 11/17/21  Yes Milton Ferguson, MD  acetaminophen (TYLENOL) 650 MG CR tablet Take 650 mg by mouth 2 (two) times daily as needed for pain.    [provider]  albuterol (PROVENTIL HFA;VENTOLIN HFA) 108 (90 Base) MCG/ACT inhaler Inhale 2 puffs into the lungs every 6 (six) hours as needed for wheezing or shortness of breath. 10/22/16   Caren Macadam, MD  amLODipine (NORVASC) 5 MG tablet TAKE 1 TABLET(5 MG) BY MOUTH DAILY 04/23/18   Herminio Commons, MD  apixaban (ELIQUIS) 5 MG TABS tablet Take 1 tablet (5 mg total) by mouth 2 (two) times daily. 01/17/21   Truddie Hidden, MD  cetirizine (ZYRTEC) 10 MG tablet Take 1 tablet (10 mg total) by mouth daily. Patient taking differently: Take 10 mg by mouth daily as needed for allergies.  12/23/16   Valentina Shaggy, MD  EPINEPHrine 0.3 mg/0.3 mL IJ SOAJ injection Inject 0.3 mg into the muscle once.  12/23/16   [provider]  fluticasone (FLONASE) 50 MCG/ACT nasal spray Place 1 spray into both nostrils daily as needed for allergies or rhinitis.    [provider]  hydrochlorothiazide (HYDRODIURIL) 25 MG tablet Take 1 tablet (25 mg total) by mouth daily. 11/19/16   Caren Macadam, MD   predniSONE (STERAPRED UNI-PAK 21 TAB) 5 MG (21) TBPK tablet Take 6 pills first day; 5 pills second day; 4 pills third day; 3 pills fourth day; 2 pills next day and 1 pill last day. 11/25/17   Sanjuana Kava, MD      Allergies    Fish allergy and Tomato    Review of Systems   Review of Systems  Respiratory:  Positive for shortness of breath.     Physical Exam Updated Vital Signs BP 122/76   Pulse 65   Temp 98.6 F (37 C) (Oral)   Resp 15   SpO2 95%  Physical Exam  ED Results / Procedures / Treatments   Labs (all labs ordered are listed, but only abnormal results are displayed) Labs Reviewed  CBC WITH DIFFERENTIAL/PLATELET - Abnormal; Notable for the following components:      Result Value   RBC 3.52 (*)    Hemoglobin 10.1 (*)    HCT 31.3 (*)    All other components within normal limits  COMPREHENSIVE METABOLIC PANEL - Abnormal; Notable for the following components:   Chloride 113 (*)    Glucose, Bld 131 (*)    Calcium 8.8 (*)    AST 12 (*)    Anion gap 4 (*)    All other components within normal limits  LIPASE, BLOOD  BRAIN NATRIURETIC PEPTIDE  TROPONIN I (HIGH SENSITIVITY)  TROPONIN I (HIGH SENSITIVITY)    EKG  EKG Interpretation  Date/Time:  Sunday November 17 2021 19:58:05 EDT Ventricular Rate:  89 PR Interval:  144 QRS Duration: 92 QT Interval:  346 QTC Calculation: 420 R Axis:   -7 Text Interpretation: Normal sinus rhythm Nonspecific T wave abnormality Abnormal ECG When compared with ECG of 23-Oct-2017 06:21, PREVIOUS ECG IS PRESENT Confirmed by Bethann Berkshire [phone]) on 11/17/2021 8:28:43 PM  Radiology CT ABDOMEN PELVIS W CONTRAST  Result Date: 11/17/2021 CLINICAL DATA:  Centralized chest/epigastric pain. Lower extremity swelling bilaterally. EXAM: CT ABDOMEN AND PELVIS WITH CONTRAST TECHNIQUE: Multidetector CT imaging of the abdomen and pelvis was performed using the standard protocol following bolus administration of intravenous contrast. RADIATION  DOSE REDUCTION: This exam was performed according to the departmental dose-optimization program which includes automated exposure control, adjustment of the mA and/or kV according to patient size and/or use of iterative reconstruction technique. CONTRAST:  OMNIPAQUE IOHEXOL 300 MG/ML  SOLN COMPARISON:  09/03/2020. FINDINGS: Lower chest: The heart is enlarged. Mild atelectasis is noted at the lung bases. Hepatobiliary: No focal liver abnormality is seen. Stones are present within the gallbladder. No biliary ductal dilatation. Pancreas: Unremarkable. No pancreatic ductal dilatation or surrounding inflammatory changes. Spleen: Normal in size without focal abnormality. Adrenals/Urinary Tract: The adrenal glands are within normal limits. The kidneys enhance symmetrically. Cysts are noted bilaterally. Scattered subcentimeter hypodensities are noted in the kidneys bilaterally which are too small to further characterize. A questionable punctate nonobstructive calculus is present in the mid right kidney. No hydronephrosis. The bladder is unremarkable. Stomach/Bowel: There is a small hiatal hernia. Stomach is within normal limits. Appendix appears normal. No evidence of bowel wall thickening, distention, or inflammatory changes. No free air or pneumatosis. Vascular/Lymphatic: No significant vascular findings are present. Nonspecific prominent lymph nodes are noted in the paraesophageal fat at the hiatal hernia. Reproductive: The uterus is enlarged and multiple calcified masses are noted, likely degenerating fibroids. No adnexal mass. Other: No abdominopelvic ascites. Musculoskeletal: Degenerative changes in the thoracolumbar spine. No acute osseous abnormality. IMPRESSION: 1. No acute intra-abdominal process. 2. Small hiatal hernia with prominent lymph nodes in the paraesophageal fat at the level of the hiatal hernia, which may be infectious or inflammatory. Endoscopy is recommended for further evaluation on follow-up.  3. Bilateral renal cysts. 4. Cholelithiasis. 5. Uterine fibroids. Electronically Signed   By: Thornell Sartorius M.D.   On: 11/17/2021 22:35   DG Chest 2 View  Result Date: 11/17/2021 CLINICAL DATA:  Severe chest pain EXAM: CHEST - 2 VIEW COMPARISON:  Chest radiograph dated 10/06/2016 and CT chest dated 12/19/2016. FINDINGS: The heart is mildly enlarged. Vascular calcifications are seen in the aortic arch. The lungs are clear. Degenerative changes are seen in the spine. IMPRESSION: 1. No active cardiopulmonary disease. 2. Cardiomegaly. Electronically Signed   By: Romona Curls M.D.   On: 11/17/2021 20:33    Procedures Procedures  {Document cardiac monitor, telemetry assessment procedure when appropriate:1}  Medications Ordered in ED Medications  sodium chloride 0.9 % bolus 500 mL (0 mLs Intravenous Stopped 11/17/21 2143)  HYDROmorphone (DILAUDID) injection 0.5 mg (0.5 mg Intravenous Given 11/17/21 2049)  ondansetron (ZOFRAN) injection 4 mg (4 mg Intravenous Given 11/17/21 2049)  pantoprazole (PROTONIX) injection 40 mg (40 mg Intravenous Given 11/17/21 2049)  iohexol (OMNIPAQUE) 300 MG/ML solution 100 mL (100 mLs Intravenous Contrast Given 11/17/21 2211)    ED Course/ Medical Decision Making/ A&P  Medical Decision Making Amount and/or Complexity of Data Reviewed Labs: ordered. Radiology: ordered.  Risk Prescription drug management.   Patient with gallstones and hiatal hernia.  She is put on protonic and given Vicodin and will follow-up with GI  {Document critical care time when appropriate:1} {Document review of labs and clinical decision tools ie heart score, Chads2Vasc2 etc:1}  {Document your independent review of radiology images, and any outside records:1} {Document your discussion with family members, caretakers, and with consultants:1} {Document social determinants of health affecting pt's care:1} {Document your decision making why or why not admission,  treatments were needed:1} Final Clinical Impression(s) / ED Diagnoses Final diagnoses:  Pain of upper abdomen    Rx / DC Orders ED Discharge Orders          Ordered    pantoprazole (PROTONIX) 20 MG tablet  Daily        11/17/21 2326    HYDROcodone-acetaminophen (NORCO/VICODIN) 5-325 MG tablet  Every 6 hours PRN        11/17/21 2326

## 2021-11-17 NOTE — ED Triage Notes (Addendum)
Pt presents with centralized chest pain/epigastric. Legs are bilaterally swollen. She says started about 10 mins ago. Hx of blood clots in lungs and legs. Still has gallbladder and appendix. She denies SOB presently but does use an inhaler at times. She states she used it earlier and it felt better but this was earlier in day.  Endorses hypertension. Takes Eliquis for clot hx. States R leg does feel more swollen that usual. Pt is crying and clutching chest

## 2021-11-17 NOTE — Discharge Instructions (Signed)
Follow-up with Dr. Gala Romney or one of his colleagues in the next couple weeks.  Return if any problems

## 2021-11-17 NOTE — ED Notes (Signed)
Patient transported to CT 

## 2022-12-30 DIAGNOSIS — I10 Essential (primary) hypertension: Secondary | ICD-10-CM | POA: Diagnosis not present

## 2022-12-30 DIAGNOSIS — Z91199 Patient's noncompliance with other medical treatment and regimen due to unspecified reason: Secondary | ICD-10-CM | POA: Diagnosis not present

## 2022-12-30 DIAGNOSIS — I739 Peripheral vascular disease, unspecified: Secondary | ICD-10-CM | POA: Diagnosis not present

## 2022-12-30 DIAGNOSIS — R0602 Shortness of breath: Secondary | ICD-10-CM | POA: Diagnosis not present

## 2022-12-30 DIAGNOSIS — J4520 Mild intermittent asthma, uncomplicated: Secondary | ICD-10-CM | POA: Diagnosis not present

## 2022-12-30 DIAGNOSIS — Z91018 Allergy to other foods: Secondary | ICD-10-CM | POA: Diagnosis not present

## 2022-12-30 DIAGNOSIS — E1165 Type 2 diabetes mellitus with hyperglycemia: Secondary | ICD-10-CM | POA: Diagnosis not present

## 2022-12-30 DIAGNOSIS — I110 Hypertensive heart disease with heart failure: Secondary | ICD-10-CM | POA: Diagnosis not present

## 2022-12-30 DIAGNOSIS — I82409 Acute embolism and thrombosis of unspecified deep veins of unspecified lower extremity: Secondary | ICD-10-CM | POA: Diagnosis not present

## 2022-12-30 DIAGNOSIS — E8770 Fluid overload, unspecified: Secondary | ICD-10-CM | POA: Diagnosis not present

## 2022-12-30 DIAGNOSIS — Z79899 Other long term (current) drug therapy: Secondary | ICD-10-CM | POA: Diagnosis not present

## 2022-12-31 ENCOUNTER — Other Ambulatory Visit (HOSPITAL_COMMUNITY): Payer: Self-pay | Admitting: Nurse Practitioner

## 2022-12-31 DIAGNOSIS — E8770 Fluid overload, unspecified: Secondary | ICD-10-CM

## 2023-01-05 ENCOUNTER — Ambulatory Visit (HOSPITAL_COMMUNITY)
Admission: RE | Admit: 2023-01-05 | Discharge: 2023-01-05 | Disposition: A | Discharge status: Home / Self Care | Payer: Medicare Other | Source: Ambulatory Visit | Attending: Nurse Practitioner | Admitting: Nurse Practitioner

## 2023-01-05 DIAGNOSIS — R600 Localized edema: Secondary | ICD-10-CM | POA: Diagnosis not present

## 2023-01-05 DIAGNOSIS — E8770 Fluid overload, unspecified: Secondary | ICD-10-CM | POA: Diagnosis not present

## 2023-01-06 DIAGNOSIS — Z23 Encounter for immunization: Secondary | ICD-10-CM | POA: Diagnosis not present

## 2023-01-06 DIAGNOSIS — I10 Essential (primary) hypertension: Secondary | ICD-10-CM | POA: Diagnosis not present

## 2023-01-06 DIAGNOSIS — Z91119 Patient's noncompliance with dietary regimen due to unspecified reason: Secondary | ICD-10-CM | POA: Diagnosis not present

## 2023-01-06 DIAGNOSIS — E8770 Fluid overload, unspecified: Secondary | ICD-10-CM | POA: Diagnosis not present

## 2023-01-06 DIAGNOSIS — Z91199 Patient's noncompliance with other medical treatment and regimen due to unspecified reason: Secondary | ICD-10-CM | POA: Diagnosis not present

## 2023-01-06 DIAGNOSIS — Z6841 Body Mass Index (BMI) 40.0 and over, adult: Secondary | ICD-10-CM | POA: Diagnosis not present

## 2023-01-06 DIAGNOSIS — R0602 Shortness of breath: Secondary | ICD-10-CM | POA: Diagnosis not present

## 2023-01-06 DIAGNOSIS — I739 Peripheral vascular disease, unspecified: Secondary | ICD-10-CM | POA: Diagnosis not present

## 2023-01-06 DIAGNOSIS — R600 Localized edema: Secondary | ICD-10-CM | POA: Diagnosis not present

## 2023-01-06 DIAGNOSIS — I82409 Acute embolism and thrombosis of unspecified deep veins of unspecified lower extremity: Secondary | ICD-10-CM | POA: Diagnosis not present

## 2023-01-07 ENCOUNTER — Ambulatory Visit (HOSPITAL_COMMUNITY): Admission: RE | Admit: 2023-01-07 | Payer: Medicare Other | Source: Ambulatory Visit

## 2023-01-08 ENCOUNTER — Other Ambulatory Visit (HOSPITAL_COMMUNITY): Payer: Self-pay | Admitting: Internal Medicine

## 2023-01-08 DIAGNOSIS — Z86711 Personal history of pulmonary embolism: Secondary | ICD-10-CM

## 2023-01-09 ENCOUNTER — Ambulatory Visit (HOSPITAL_COMMUNITY): Admission: RE | Admit: 2023-01-09 | Payer: Medicare Other | Source: Ambulatory Visit

## 2023-01-10 ENCOUNTER — Ambulatory Visit (HOSPITAL_COMMUNITY): Payer: Medicare Other

## 2023-01-12 ENCOUNTER — Ambulatory Visit (HOSPITAL_COMMUNITY)
Admission: RE | Admit: 2023-01-12 | Discharge: 2023-01-12 | Disposition: A | Discharge status: Home / Self Care | Payer: Medicare Other | Source: Ambulatory Visit | Attending: Internal Medicine | Admitting: Internal Medicine

## 2023-01-12 DIAGNOSIS — I2699 Other pulmonary embolism without acute cor pulmonale: Secondary | ICD-10-CM | POA: Diagnosis not present

## 2023-01-12 DIAGNOSIS — R079 Chest pain, unspecified: Secondary | ICD-10-CM | POA: Diagnosis not present

## 2023-01-12 DIAGNOSIS — Z86711 Personal history of pulmonary embolism: Secondary | ICD-10-CM | POA: Insufficient documentation

## 2023-01-12 DIAGNOSIS — E041 Nontoxic single thyroid nodule: Secondary | ICD-10-CM | POA: Diagnosis not present

## 2023-01-12 DIAGNOSIS — M7989 Other specified soft tissue disorders: Secondary | ICD-10-CM | POA: Diagnosis not present

## 2023-01-12 MED ORDER — IOHEXOL 350 MG/ML SOLN
80.0000 mL | Freq: Once | INTRAVENOUS | Status: AC | PRN
  Administered 2023-01-12: 75 mL via INTRAVENOUS

## 2023-01-20 ENCOUNTER — Other Ambulatory Visit (HOSPITAL_COMMUNITY): Payer: Self-pay | Admitting: Family Medicine

## 2023-01-20 DIAGNOSIS — R0602 Shortness of breath: Secondary | ICD-10-CM | POA: Diagnosis not present

## 2023-01-20 DIAGNOSIS — I739 Peripheral vascular disease, unspecified: Secondary | ICD-10-CM

## 2023-01-20 DIAGNOSIS — I82409 Acute embolism and thrombosis of unspecified deep veins of unspecified lower extremity: Secondary | ICD-10-CM | POA: Diagnosis not present

## 2023-01-20 DIAGNOSIS — I700 Atherosclerosis of aorta: Secondary | ICD-10-CM | POA: Diagnosis not present

## 2023-01-20 DIAGNOSIS — J4520 Mild intermittent asthma, uncomplicated: Secondary | ICD-10-CM | POA: Diagnosis not present

## 2023-01-20 DIAGNOSIS — E559 Vitamin D deficiency, unspecified: Secondary | ICD-10-CM | POA: Diagnosis not present

## 2023-01-20 DIAGNOSIS — E041 Nontoxic single thyroid nodule: Secondary | ICD-10-CM | POA: Diagnosis not present

## 2023-01-20 DIAGNOSIS — E785 Hyperlipidemia, unspecified: Secondary | ICD-10-CM | POA: Diagnosis not present

## 2023-01-20 DIAGNOSIS — Z91199 Patient's noncompliance with other medical treatment and regimen due to unspecified reason: Secondary | ICD-10-CM | POA: Diagnosis not present

## 2023-01-20 DIAGNOSIS — R2240 Localized swelling, mass and lump, unspecified lower limb: Secondary | ICD-10-CM | POA: Diagnosis not present

## 2023-01-20 DIAGNOSIS — E8770 Fluid overload, unspecified: Secondary | ICD-10-CM | POA: Diagnosis not present

## 2023-01-20 DIAGNOSIS — I10 Essential (primary) hypertension: Secondary | ICD-10-CM | POA: Diagnosis not present

## 2023-01-20 DIAGNOSIS — M179 Osteoarthritis of knee, unspecified: Secondary | ICD-10-CM | POA: Diagnosis not present

## 2023-01-22 ENCOUNTER — Other Ambulatory Visit (HOSPITAL_COMMUNITY): Payer: Self-pay | Admitting: Family Medicine

## 2023-01-22 DIAGNOSIS — E8770 Fluid overload, unspecified: Secondary | ICD-10-CM

## 2023-01-24 DIAGNOSIS — G90529 Complex regional pain syndrome I of unspecified lower limb: Secondary | ICD-10-CM | POA: Diagnosis not present

## 2023-01-24 DIAGNOSIS — E785 Hyperlipidemia, unspecified: Secondary | ICD-10-CM | POA: Diagnosis not present

## 2023-01-24 DIAGNOSIS — E1151 Type 2 diabetes mellitus with diabetic peripheral angiopathy without gangrene: Secondary | ICD-10-CM | POA: Diagnosis not present

## 2023-01-24 DIAGNOSIS — Z6841 Body Mass Index (BMI) 40.0 and over, adult: Secondary | ICD-10-CM | POA: Diagnosis not present

## 2023-01-24 DIAGNOSIS — Z556 Problems related to health literacy: Secondary | ICD-10-CM | POA: Diagnosis not present

## 2023-01-24 DIAGNOSIS — I10 Essential (primary) hypertension: Secondary | ICD-10-CM | POA: Diagnosis not present

## 2023-01-24 DIAGNOSIS — M1711 Unilateral primary osteoarthritis, right knee: Secondary | ICD-10-CM | POA: Diagnosis not present

## 2023-01-24 DIAGNOSIS — Z7901 Long term (current) use of anticoagulants: Secondary | ICD-10-CM | POA: Diagnosis not present

## 2023-01-24 DIAGNOSIS — Z86718 Personal history of other venous thrombosis and embolism: Secondary | ICD-10-CM | POA: Diagnosis not present

## 2023-01-24 DIAGNOSIS — Z86711 Personal history of pulmonary embolism: Secondary | ICD-10-CM | POA: Diagnosis not present

## 2023-01-24 DIAGNOSIS — I700 Atherosclerosis of aorta: Secondary | ICD-10-CM | POA: Diagnosis not present

## 2023-01-24 DIAGNOSIS — K449 Diaphragmatic hernia without obstruction or gangrene: Secondary | ICD-10-CM | POA: Diagnosis not present

## 2023-01-24 DIAGNOSIS — E1165 Type 2 diabetes mellitus with hyperglycemia: Secondary | ICD-10-CM | POA: Diagnosis not present

## 2023-01-24 DIAGNOSIS — J4520 Mild intermittent asthma, uncomplicated: Secondary | ICD-10-CM | POA: Diagnosis not present

## 2023-01-24 DIAGNOSIS — E872 Acidosis, unspecified: Secondary | ICD-10-CM | POA: Diagnosis not present

## 2023-01-24 DIAGNOSIS — E559 Vitamin D deficiency, unspecified: Secondary | ICD-10-CM | POA: Diagnosis not present

## 2023-01-24 DIAGNOSIS — E041 Nontoxic single thyroid nodule: Secondary | ICD-10-CM | POA: Diagnosis not present

## 2023-01-24 DIAGNOSIS — Z91199 Patient's noncompliance with other medical treatment and regimen due to unspecified reason: Secondary | ICD-10-CM | POA: Diagnosis not present

## 2023-01-28 DIAGNOSIS — Z6841 Body Mass Index (BMI) 40.0 and over, adult: Secondary | ICD-10-CM | POA: Diagnosis not present

## 2023-01-28 DIAGNOSIS — E1165 Type 2 diabetes mellitus with hyperglycemia: Secondary | ICD-10-CM | POA: Diagnosis not present

## 2023-01-28 DIAGNOSIS — I700 Atherosclerosis of aorta: Secondary | ICD-10-CM | POA: Diagnosis not present

## 2023-01-28 DIAGNOSIS — E1151 Type 2 diabetes mellitus with diabetic peripheral angiopathy without gangrene: Secondary | ICD-10-CM | POA: Diagnosis not present

## 2023-01-28 DIAGNOSIS — M1711 Unilateral primary osteoarthritis, right knee: Secondary | ICD-10-CM | POA: Diagnosis not present

## 2023-01-29 ENCOUNTER — Ambulatory Visit (HOSPITAL_COMMUNITY): Admission: RE | Admit: 2023-01-29 | Payer: Medicare Other | Source: Ambulatory Visit

## 2023-01-29 ENCOUNTER — Ambulatory Visit (HOSPITAL_COMMUNITY): Payer: Medicare Other | Attending: Family Medicine

## 2023-01-29 ENCOUNTER — Encounter (HOSPITAL_COMMUNITY): Payer: Self-pay

## 2023-01-30 DIAGNOSIS — M1711 Unilateral primary osteoarthritis, right knee: Secondary | ICD-10-CM | POA: Diagnosis not present

## 2023-01-30 DIAGNOSIS — E1165 Type 2 diabetes mellitus with hyperglycemia: Secondary | ICD-10-CM | POA: Diagnosis not present

## 2023-01-30 DIAGNOSIS — I700 Atherosclerosis of aorta: Secondary | ICD-10-CM | POA: Diagnosis not present

## 2023-01-30 DIAGNOSIS — Z6841 Body Mass Index (BMI) 40.0 and over, adult: Secondary | ICD-10-CM | POA: Diagnosis not present

## 2023-01-30 DIAGNOSIS — E1151 Type 2 diabetes mellitus with diabetic peripheral angiopathy without gangrene: Secondary | ICD-10-CM | POA: Diagnosis not present

## 2023-02-03 ENCOUNTER — Ambulatory Visit (HOSPITAL_COMMUNITY)
Admission: RE | Admit: 2023-02-03 | Discharge: 2023-02-03 | Disposition: A | Discharge status: Home / Self Care | Payer: Medicare Other | Source: Ambulatory Visit | Attending: Family Medicine | Admitting: Family Medicine

## 2023-02-03 ENCOUNTER — Ambulatory Visit (HOSPITAL_COMMUNITY): Admission: RE | Admit: 2023-02-03 | Payer: Medicare Other | Source: Ambulatory Visit

## 2023-02-03 DIAGNOSIS — E8770 Fluid overload, unspecified: Secondary | ICD-10-CM | POA: Insufficient documentation

## 2023-02-03 LAB — ECHOCARDIOGRAM COMPLETE
Area-P 1/2: 4.49 cm2
S' Lateral: 3.3 cm

## 2023-02-05 DIAGNOSIS — I700 Atherosclerosis of aorta: Secondary | ICD-10-CM | POA: Diagnosis not present

## 2023-02-05 DIAGNOSIS — E1151 Type 2 diabetes mellitus with diabetic peripheral angiopathy without gangrene: Secondary | ICD-10-CM | POA: Diagnosis not present

## 2023-02-05 DIAGNOSIS — E1165 Type 2 diabetes mellitus with hyperglycemia: Secondary | ICD-10-CM | POA: Diagnosis not present

## 2023-02-05 DIAGNOSIS — Z6841 Body Mass Index (BMI) 40.0 and over, adult: Secondary | ICD-10-CM | POA: Diagnosis not present

## 2023-02-05 DIAGNOSIS — M1711 Unilateral primary osteoarthritis, right knee: Secondary | ICD-10-CM | POA: Diagnosis not present

## 2023-02-11 ENCOUNTER — Ambulatory Visit (HOSPITAL_COMMUNITY)
Admission: RE | Admit: 2023-02-11 | Discharge: 2023-02-11 | Discharge status: Home / Self Care | Payer: Medicare Other | Source: Ambulatory Visit | Attending: Family Medicine | Admitting: Family Medicine

## 2023-02-11 ENCOUNTER — Ambulatory Visit (HOSPITAL_COMMUNITY)
Admission: RE | Admit: 2023-02-11 | Discharge: 2023-02-11 | Disposition: A | Discharge status: Home / Self Care | Payer: Medicare Other | Source: Ambulatory Visit | Attending: Family Medicine | Admitting: Family Medicine

## 2023-02-11 DIAGNOSIS — I739 Peripheral vascular disease, unspecified: Secondary | ICD-10-CM | POA: Insufficient documentation

## 2023-02-11 DIAGNOSIS — E041 Nontoxic single thyroid nodule: Secondary | ICD-10-CM | POA: Insufficient documentation

## 2023-02-11 DIAGNOSIS — E042 Nontoxic multinodular goiter: Secondary | ICD-10-CM | POA: Diagnosis not present

## 2023-02-11 DIAGNOSIS — R239 Unspecified skin changes: Secondary | ICD-10-CM | POA: Diagnosis not present

## 2023-02-14 DIAGNOSIS — I700 Atherosclerosis of aorta: Secondary | ICD-10-CM | POA: Diagnosis not present

## 2023-02-14 DIAGNOSIS — Z6841 Body Mass Index (BMI) 40.0 and over, adult: Secondary | ICD-10-CM | POA: Diagnosis not present

## 2023-02-14 DIAGNOSIS — E1151 Type 2 diabetes mellitus with diabetic peripheral angiopathy without gangrene: Secondary | ICD-10-CM | POA: Diagnosis not present

## 2023-02-14 DIAGNOSIS — E1165 Type 2 diabetes mellitus with hyperglycemia: Secondary | ICD-10-CM | POA: Diagnosis not present

## 2023-02-14 DIAGNOSIS — M1711 Unilateral primary osteoarthritis, right knee: Secondary | ICD-10-CM | POA: Diagnosis not present

## 2023-02-17 DIAGNOSIS — E1165 Type 2 diabetes mellitus with hyperglycemia: Secondary | ICD-10-CM | POA: Diagnosis not present

## 2023-02-17 DIAGNOSIS — I700 Atherosclerosis of aorta: Secondary | ICD-10-CM | POA: Diagnosis not present

## 2023-02-17 DIAGNOSIS — M1711 Unilateral primary osteoarthritis, right knee: Secondary | ICD-10-CM | POA: Diagnosis not present

## 2023-02-17 DIAGNOSIS — E1151 Type 2 diabetes mellitus with diabetic peripheral angiopathy without gangrene: Secondary | ICD-10-CM | POA: Diagnosis not present

## 2023-02-17 DIAGNOSIS — Z6841 Body Mass Index (BMI) 40.0 and over, adult: Secondary | ICD-10-CM | POA: Diagnosis not present

## 2023-02-23 DIAGNOSIS — Z91199 Patient's noncompliance with other medical treatment and regimen due to unspecified reason: Secondary | ICD-10-CM | POA: Diagnosis not present

## 2023-02-23 DIAGNOSIS — M1711 Unilateral primary osteoarthritis, right knee: Secondary | ICD-10-CM | POA: Diagnosis not present

## 2023-02-23 DIAGNOSIS — E559 Vitamin D deficiency, unspecified: Secondary | ICD-10-CM | POA: Diagnosis not present

## 2023-02-23 DIAGNOSIS — E785 Hyperlipidemia, unspecified: Secondary | ICD-10-CM | POA: Diagnosis not present

## 2023-02-23 DIAGNOSIS — J4520 Mild intermittent asthma, uncomplicated: Secondary | ICD-10-CM | POA: Diagnosis not present

## 2023-02-23 DIAGNOSIS — I700 Atherosclerosis of aorta: Secondary | ICD-10-CM | POA: Diagnosis not present

## 2023-02-23 DIAGNOSIS — Z7901 Long term (current) use of anticoagulants: Secondary | ICD-10-CM | POA: Diagnosis not present

## 2023-02-23 DIAGNOSIS — Z86711 Personal history of pulmonary embolism: Secondary | ICD-10-CM | POA: Diagnosis not present

## 2023-02-23 DIAGNOSIS — E1151 Type 2 diabetes mellitus with diabetic peripheral angiopathy without gangrene: Secondary | ICD-10-CM | POA: Diagnosis not present

## 2023-02-23 DIAGNOSIS — Z556 Problems related to health literacy: Secondary | ICD-10-CM | POA: Diagnosis not present

## 2023-02-23 DIAGNOSIS — E1165 Type 2 diabetes mellitus with hyperglycemia: Secondary | ICD-10-CM | POA: Diagnosis not present

## 2023-02-23 DIAGNOSIS — E041 Nontoxic single thyroid nodule: Secondary | ICD-10-CM | POA: Diagnosis not present

## 2023-02-23 DIAGNOSIS — E872 Acidosis, unspecified: Secondary | ICD-10-CM | POA: Diagnosis not present

## 2023-02-23 DIAGNOSIS — G90529 Complex regional pain syndrome I of unspecified lower limb: Secondary | ICD-10-CM | POA: Diagnosis not present

## 2023-02-23 DIAGNOSIS — Z86718 Personal history of other venous thrombosis and embolism: Secondary | ICD-10-CM | POA: Diagnosis not present

## 2023-02-23 DIAGNOSIS — Z6841 Body Mass Index (BMI) 40.0 and over, adult: Secondary | ICD-10-CM | POA: Diagnosis not present

## 2023-02-23 DIAGNOSIS — K449 Diaphragmatic hernia without obstruction or gangrene: Secondary | ICD-10-CM | POA: Diagnosis not present

## 2023-02-23 DIAGNOSIS — I10 Essential (primary) hypertension: Secondary | ICD-10-CM | POA: Diagnosis not present

## 2023-03-18 DIAGNOSIS — E1151 Type 2 diabetes mellitus with diabetic peripheral angiopathy without gangrene: Secondary | ICD-10-CM | POA: Diagnosis not present

## 2023-03-18 DIAGNOSIS — E1165 Type 2 diabetes mellitus with hyperglycemia: Secondary | ICD-10-CM | POA: Diagnosis not present

## 2023-03-18 DIAGNOSIS — I700 Atherosclerosis of aorta: Secondary | ICD-10-CM | POA: Diagnosis not present

## 2023-03-18 DIAGNOSIS — M1711 Unilateral primary osteoarthritis, right knee: Secondary | ICD-10-CM | POA: Diagnosis not present

## 2023-03-18 DIAGNOSIS — Z6841 Body Mass Index (BMI) 40.0 and over, adult: Secondary | ICD-10-CM | POA: Diagnosis not present

## 2024-01-11 ENCOUNTER — Other Ambulatory Visit (HOSPITAL_COMMUNITY): Payer: Self-pay | Admitting: Family Medicine

## 2024-01-11 DIAGNOSIS — E041 Nontoxic single thyroid nodule: Secondary | ICD-10-CM

## 2024-01-19 ENCOUNTER — Ambulatory Visit (HOSPITAL_COMMUNITY): Attending: Family Medicine

## 2024-01-19 ENCOUNTER — Encounter (HOSPITAL_COMMUNITY): Payer: Self-pay

## 2024-01-28 ENCOUNTER — Emergency Department (HOSPITAL_COMMUNITY)

## 2024-01-28 ENCOUNTER — Emergency Department (HOSPITAL_COMMUNITY)
Admission: EM | Admit: 2024-01-28 | Discharge: 2024-01-28 | Disposition: A | Discharge status: Home / Self Care | Attending: Emergency Medicine | Admitting: Emergency Medicine

## 2024-01-28 ENCOUNTER — Other Ambulatory Visit: Payer: Self-pay

## 2024-01-28 ENCOUNTER — Encounter (HOSPITAL_COMMUNITY): Payer: Self-pay | Admitting: Emergency Medicine

## 2024-01-28 DIAGNOSIS — Z7901 Long term (current) use of anticoagulants: Secondary | ICD-10-CM | POA: Insufficient documentation

## 2024-01-28 DIAGNOSIS — Z86718 Personal history of other venous thrombosis and embolism: Secondary | ICD-10-CM | POA: Insufficient documentation

## 2024-01-28 DIAGNOSIS — E860 Dehydration: Secondary | ICD-10-CM

## 2024-01-28 DIAGNOSIS — R109 Unspecified abdominal pain: Secondary | ICD-10-CM | POA: Diagnosis not present

## 2024-01-28 DIAGNOSIS — R42 Dizziness and giddiness: Secondary | ICD-10-CM | POA: Diagnosis not present

## 2024-01-28 DIAGNOSIS — R55 Syncope and collapse: Secondary | ICD-10-CM | POA: Insufficient documentation

## 2024-01-28 LAB — COMPREHENSIVE METABOLIC PANEL WITH GFR
ALT: <5 U/L (ref 0–44)
AST: 13 U/L — ABNORMAL LOW (ref 15–41)
Albumin: 4 g/dL (ref 3.5–5.0)
Alkaline Phosphatase: 101 U/L (ref 38–126)
Anion gap: 6 (ref 5–15)
BUN: 14 mg/dL (ref 8–23)
CO2: 29 mmol/L (ref 22–32)
Calcium: 9.2 mg/dL (ref 8.9–10.3)
Chloride: 104 mmol/L (ref 98–111)
Creatinine, Ser: 0.82 mg/dL (ref 0.44–1.00)
GFR, Estimated: >60 mL/min
Glucose, Bld: 113 mg/dL — ABNORMAL HIGH (ref 70–99)
Potassium: 4.2 mmol/L (ref 3.5–5.1)
Sodium: 139 mmol/L (ref 135–145)
Total Bilirubin: 0.3 mg/dL (ref 0.0–1.2)
Total Protein: 7.2 g/dL (ref 6.5–8.1)

## 2024-01-28 LAB — CBC WITH DIFFERENTIAL/PLATELET
Abs Immature Granulocytes: 0.01 K/uL (ref 0.00–0.07)
Basophils Absolute: 0 K/uL (ref 0.0–0.1)
Basophils Relative: 0 %
Eosinophils Absolute: 0.1 K/uL (ref 0.0–0.5)
Eosinophils Relative: 2 %
HCT: 35.2 % — ABNORMAL LOW (ref 36.0–46.0)
Hemoglobin: 11 g/dL — ABNORMAL LOW (ref 12.0–15.0)
Immature Granulocytes: 0 %
Lymphocytes Relative: 37 %
Lymphs Abs: 2 K/uL (ref 0.7–4.0)
MCH: 28.2 pg (ref 26.0–34.0)
MCHC: 31.3 g/dL (ref 30.0–36.0)
MCV: 90.3 fL (ref 80.0–100.0)
Monocytes Absolute: 0.3 K/uL (ref 0.1–1.0)
Monocytes Relative: 6 %
Neutro Abs: 3 K/uL (ref 1.7–7.7)
Neutrophils Relative %: 55 %
Platelets: 266 K/uL (ref 150–400)
RBC: 3.9 MIL/uL (ref 3.87–5.11)
RDW: 15.2 % (ref 11.5–15.5)
WBC: 5.5 K/uL (ref 4.0–10.5)
nRBC: 0 % (ref 0.0–0.2)

## 2024-01-28 LAB — LIPASE, BLOOD: Lipase: 21 U/L (ref 11–51)

## 2024-01-28 LAB — TROPONIN T, HIGH SENSITIVITY: Troponin T High Sensitivity: <15 ng/L (ref 0–19)

## 2024-01-28 LAB — CBG MONITORING, ED: Glucose-Capillary: 101 mg/dL — ABNORMAL HIGH (ref 70–99)

## 2024-01-28 MED ORDER — LACTATED RINGERS IV BOLUS
1000.0000 mL | Freq: Once | INTRAVENOUS | Status: AC
  Administered 2024-01-28: 1000 mL via INTRAVENOUS

## 2024-01-28 NOTE — ED Provider Notes (Signed)
 "  [facility] EMERGENCY DEPARTMENT AT [facility]  Provider Note  CSN: [id number] Arrival date & time: 01/28/24 9490  History Chief Complaint  Patient presents with   Near Syncope    ARNECIA ECTOR is a 74 y.o. female with history of arthritis uses a walker and remote DVT on eliquis  brought to the ED via EMS after an episode of syncope at home. She reports she woke up with abdominal cramping and felt like she needed to have a bowel movement. She went to the bathroom and sat down but nothing came out. She was in the hallway on the way back to bed when she began to feel hot and weak. She sank to her knees and called for help. Family came and moved her walker and lowered her to the floor, no falls or head injury but they reported she was 'out of it' for a brief time. They also apparently did chest compressions on her but no reported loss of pulses. She woke up when EMS arrived and had a BM in her pants before leaving the house. She is feeling better on arrival. Denies any headache, blurry vision, CP, SOB. Not currently having abdominal pain. RN reports stool was soft and non bloody. No vomiting.    Home Medications Prior to Admission medications  Medication Sig Start Date End Date Taking? Authorizing Provider  acetaminophen  (TYLENOL ) 650 MG CR tablet Take 650 mg by mouth 2 (two) times daily as needed for pain.    [provider]  albuterol  (PROVENTIL  HFA;VENTOLIN  HFA) 108 (90 Base) MCG/ACT inhaler Inhale 2 puffs into the lungs every 6 (six) hours as needed for wheezing or shortness of breath. 10/22/16   Rolinda Millman, MD  amLODipine  (NORVASC ) 5 MG tablet TAKE 1 TABLET(5 MG) BY MOUTH DAILY 04/23/18   Charls Pearla LABOR, MD  apixaban  (ELIQUIS ) 5 MG TABS tablet Take 1 tablet (5 mg total) by mouth 2 (two) times daily. 01/17/21   Roselyn Carlin NOVAK, MD  cetirizine  (ZYRTEC ) 10 MG tablet Take 1 tablet (10 mg total) by mouth daily. Patient taking differently: Take 10 mg by mouth daily as  needed for allergies.  12/23/16   Iva Marty Saltness, MD  EPINEPHrine  0.3 mg/0.3 mL IJ SOAJ injection Inject 0.3 mg into the muscle once.  12/23/16   [provider]  fluticasone  (FLONASE ) 50 MCG/ACT nasal spray Place 1 spray into both nostrils daily as needed for allergies or rhinitis.    [provider]  hydrochlorothiazide  (HYDRODIURIL ) 25 MG tablet Take 1 tablet (25 mg total) by mouth daily. 11/19/16   Rolinda Millman, MD  HYDROcodone -acetaminophen  (NORCO/VICODIN) 5-325 MG tablet Take 1 tablet by mouth every 6 (six) hours as needed. 11/17/21   Suzette Pac, MD  pantoprazole  (PROTONIX ) 20 MG tablet Take 1 tablet (20 mg total) by mouth daily. 11/17/21   Suzette Pac, MD  predniSONE  (STERAPRED UNI-PAK 21 TAB) 5 MG (21) TBPK tablet Take 6 pills first day; 5 pills second day; 4 pills third day; 3 pills fourth day; 2 pills next day and 1 pill last day. 11/25/17   Brenna Lin, MD     Allergies    Fish allergy and Tomato   Review of Systems   Review of Systems Please see HPI for pertinent positives and negatives  Physical Exam BP (!) 137/93   Pulse 83   Temp 97.8 F (36.6 C) (Oral)   Resp 17   Ht 5' 6 (1.676 m)   Wt 104 kg  SpO2 93%   BMI 37.01 kg/m   Physical Exam Vitals and nursing note reviewed.  Constitutional:      Appearance: Normal appearance.  HENT:     Head: Normocephalic and atraumatic.     Nose: Nose normal.     Mouth/Throat:     Mouth: Mucous membranes are moist.  Eyes:     Extraocular Movements: Extraocular movements intact.     Conjunctiva/sclera: Conjunctivae normal.  Cardiovascular:     Rate and Rhythm: Normal rate.  Pulmonary:     Effort: Pulmonary effort is normal.     Breath sounds: Normal breath sounds.  Abdominal:     General: Abdomen is flat.     Palpations: Abdomen is soft.     Tenderness: There is no abdominal tenderness.  Musculoskeletal:        General: No swelling. Normal range of motion.     Cervical back: Neck  supple.  Skin:    General: Skin is warm and dry.  Neurological:     General: No focal deficit present.     Mental Status: She is alert.  Psychiatric:        Mood and Affect: Mood normal.     ED Results / Procedures / Treatments   EKG EKG Interpretation Date/Time:  Thursday January 28 2024 05:35:16 EST Ventricular Rate:  66 PR Interval:  193 QRS Duration:  105 QT Interval:  410 QTC Calculation: 430 R Axis:   -24  Text Interpretation: Sinus rhythm Atrial premature complex Abnormal R-wave progression, early transition Probable left ventricular hypertrophy No significant change since last tracing Confirmed by Roselyn Dunnings [phone]) on 01/28/2024 5:55:55 AM  Procedures Procedures  Medications Ordered in the ED Medications - No data to display  Initial Impression and Plan  Patient here with an episode of syncope likely vasovagal. Feeling better now. Vitals and exam are reassuring. Will check labs, EKG and head CT given anticoagulation. Monitor in the ED.   ED Course   Clinical Course as of 01/28/24 0652  Thu Jan 28, 2024  9377 I personally viewed the images from radiology studies and agree with radiologist interpretation: CT neg.  [CS]  0635 CBC is unremarkable.  [CS]  0651 CMP, Lipase and Trop are normal. Patient remains asymptomatic and is eager to go home. Recommend she avoid straining and to continue to use walker around the house. PCP follow up, RTED for any other concerns.   [CS]    Clinical Course User Index [CS] Roselyn Dunnings NOVAK, MD     MDM Rules/Calculators/A&P Medical Decision Making Given presenting complaint, I considered that admission might be necessary. After review of results from ED lab and/or imaging studies, admission to the hospital is not indicated at this time.    Problems Addressed: Vasovagal syncope: acute illness or injury  Amount and/or Complexity of Data Reviewed Labs: ordered. Decision-making details documented in ED Course. Radiology:  ordered and independent interpretation performed. Decision-making details documented in ED Course. ECG/medicine tests: ordered and independent interpretation performed. Decision-making details documented in ED Course.  Risk Decision regarding hospitalization.     Final Clinical Impression(s) / ED Diagnoses Final diagnoses:  Vasovagal syncope    Rx / DC Orders ED Discharge Orders     None        Roselyn Dunnings NOVAK, MD 01/28/24 [phone]  "

## 2024-01-28 NOTE — ED Triage Notes (Signed)
 Pt bib [facility] from home for a near syncopal episode. Pt went to restroom to have a BM but was unsuccessful. Pt got up and started to ambulate to bedroom and felt faint and hot. Pts family helped pt down to the floor and stated she was disoriented for a few minutes. Never fully lost consciousness. Pt is back to baseline and has no complaints at this time.

## 2024-01-28 NOTE — ED Notes (Signed)
 Patient transported to CT

## 2024-01-28 NOTE — ED Notes (Signed)
 Pt had a BM on arrival. PT was cleaned up and placed in a clean brief. Non slick socks and warm blanket provided for pt. EDP and family now at bedside.

## 2024-01-28 NOTE — ED Notes (Signed)
 Pt was up for D/C home. Pt walked to restroom and bent over to pick something up and when she stood back up she became dizzy and weak and stated she didn't feel well. Pt placed back in bed and edp made aware pt was not stable for d/c at this time.

## 2024-01-28 NOTE — ED Provider Notes (Signed)
" °  Physical Exam  BP (!) 137/93   Pulse 83   Temp 97.8 F (36.6 C) (Oral)   Resp 17   Ht 5' 6 (1.676 m)   Wt 104 kg   SpO2 93%   BMI 37.01 kg/m   Physical Exam  Procedures  Procedures  ED Course / MDM   Clinical Course as of 01/28/24 1011  Thu Jan 28, 2024  0622 I personally viewed the images from radiology studies and agree with radiologist interpretation: CT neg.  [CS]  0635 CBC is unremarkable.  [CS]  0651 CMP, Lipase and Trop are normal. Patient remains asymptomatic and is eager to go home. Recommend she avoid straining and to continue to use walker around the house. PCP follow up, RTED for any other concerns.   [CS]  [phone] Patient set to be discharged but unfortunately became very weak when she got up to walk in and is requesting to be seen again. [RP]  [phone] Reassessed pt says she got lightheaded again after having a bowel movement.  No blood in her stool or melena.  No chest pain or shortness of breath.  They are concerned that she is dehydrated.  Will give fluids and reassess. [RP]  0950 Orthostatics negative. [RP]  1010 Patient reassessed.  She has been able to get up and move about the room.  Feels back to her baseline.  Suspect that she may have been somewhat dehydrated on top of having a vasovagal event.  Will have her follow-up with her primary doctor in several days.  Family counseled to make sure that she stays well-hydrated at home [RP]    Clinical Course User Index [CS] Roselyn Carlin NOVAK, MD [RP] Yolande Lamar BROCKS, MD   Medical Decision Making Amount and/or Complexity of Data Reviewed Labs: ordered. Radiology: ordered.      Yolande Lamar BROCKS, MD 01/28/24 1011  "

## 2024-01-28 NOTE — Discharge Instructions (Addendum)
 You were seen for your dizziness and almost passing out in the emergency department.   At home, please stay well-hydrated.    Check your MyChart online for the results of any tests that had not resulted by the time you left the emergency department.   Follow-up with your primary doctor in 2-3 days regarding your visit.    Return immediately to the emergency department if you experience any of the following: Fainting, chest pain, shortness of breath, or any other concerning symptoms.    Thank you for visiting our Emergency Department. It was a pleasure taking care of you today.
